# Patient Record
Sex: Female | Born: 1935 | Race: White | Hispanic: No | Smoking: Never smoker
Health system: Southern US, Community
[De-identification: ages and names within clinical notes are randomized; demographics above are authoritative.]

## PROBLEM LIST (undated history)

## (undated) DIAGNOSIS — E119 Type 2 diabetes mellitus without complications: Secondary | ICD-10-CM

## (undated) HISTORY — DX: Type 2 diabetes mellitus without complications: E11.9

## (undated) HISTORY — PX: ABDOMINAL HYSTERECTOMY: SHX81

## (undated) HISTORY — PX: VARICOSE VEIN SURGERY: SHX832

---

## 2006-12-05 ENCOUNTER — Inpatient Hospital Stay: Payer: Self-pay | Admitting: Internal Medicine

## 2006-12-05 ENCOUNTER — Other Ambulatory Visit: Payer: Self-pay

## 2007-08-06 ENCOUNTER — Ambulatory Visit: Payer: Self-pay | Admitting: Family Medicine

## 2007-08-07 ENCOUNTER — Ambulatory Visit: Payer: Self-pay | Admitting: Family Medicine

## 2007-08-14 ENCOUNTER — Ambulatory Visit: Payer: Self-pay | Admitting: Cardiology

## 2007-08-14 ENCOUNTER — Inpatient Hospital Stay (HOSPITAL_COMMUNITY): Admission: EM | Admit: 2007-08-14 | Discharge: 2007-08-20 | Payer: Self-pay | Admitting: Emergency Medicine

## 2007-08-15 ENCOUNTER — Ambulatory Visit: Payer: Self-pay | Admitting: Vascular Surgery

## 2007-08-15 ENCOUNTER — Encounter (INDEPENDENT_AMBULATORY_CARE_PROVIDER_SITE_OTHER): Payer: Self-pay | Admitting: Internal Medicine

## 2008-12-25 ENCOUNTER — Inpatient Hospital Stay (HOSPITAL_COMMUNITY): Admission: EM | Admit: 2008-12-25 | Discharge: 2008-12-29 | Payer: Self-pay | Admitting: Emergency Medicine

## 2008-12-25 ENCOUNTER — Ambulatory Visit: Payer: Self-pay | Admitting: Cardiology

## 2008-12-27 ENCOUNTER — Ambulatory Visit: Payer: Self-pay | Admitting: Physical Medicine & Rehabilitation

## 2008-12-28 ENCOUNTER — Encounter (INDEPENDENT_AMBULATORY_CARE_PROVIDER_SITE_OTHER): Payer: Self-pay | Admitting: Internal Medicine

## 2009-01-22 ENCOUNTER — Ambulatory Visit: Payer: Self-pay | Admitting: Infectious Diseases

## 2009-01-22 ENCOUNTER — Observation Stay (HOSPITAL_COMMUNITY): Admission: EM | Admit: 2009-01-22 | Discharge: 2009-01-24 | Payer: Self-pay | Admitting: Emergency Medicine

## 2009-01-27 ENCOUNTER — Inpatient Hospital Stay (HOSPITAL_COMMUNITY): Admission: EM | Admit: 2009-01-27 | Discharge: 2009-02-01 | Payer: Self-pay | Admitting: Emergency Medicine

## 2010-07-20 LAB — GLUCOSE, CAPILLARY
Glucose-Capillary: 101 mg/dL — ABNORMAL HIGH (ref 70–99)
Glucose-Capillary: 105 mg/dL — ABNORMAL HIGH (ref 70–99)
Glucose-Capillary: 105 mg/dL — ABNORMAL HIGH (ref 70–99)
Glucose-Capillary: 106 mg/dL — ABNORMAL HIGH (ref 70–99)
Glucose-Capillary: 119 mg/dL — ABNORMAL HIGH (ref 70–99)
Glucose-Capillary: 136 mg/dL — ABNORMAL HIGH (ref 70–99)
Glucose-Capillary: 144 mg/dL — ABNORMAL HIGH (ref 70–99)
Glucose-Capillary: 145 mg/dL — ABNORMAL HIGH (ref 70–99)
Glucose-Capillary: 150 mg/dL — ABNORMAL HIGH (ref 70–99)
Glucose-Capillary: 160 mg/dL — ABNORMAL HIGH (ref 70–99)
Glucose-Capillary: 169 mg/dL — ABNORMAL HIGH (ref 70–99)
Glucose-Capillary: 60 mg/dL — ABNORMAL LOW (ref 70–99)
Glucose-Capillary: 73 mg/dL (ref 70–99)
Glucose-Capillary: 86 mg/dL (ref 70–99)

## 2010-07-20 LAB — CBC
HCT: 35 % — ABNORMAL LOW (ref 36.0–46.0)
HCT: 36.9 % (ref 36.0–46.0)
Hemoglobin: 12.1 g/dL (ref 12.0–15.0)
MCV: 90.8 fL (ref 78.0–100.0)
MCV: 91.7 fL (ref 78.0–100.0)
Platelets: 199 10*3/uL (ref 150–400)
Platelets: 227 10*3/uL (ref 150–400)
RBC: 4.06 MIL/uL (ref 3.87–5.11)
RBC: 4.13 MIL/uL (ref 3.87–5.11)
WBC: 4.2 10*3/uL (ref 4.0–10.5)
WBC: 4.9 10*3/uL (ref 4.0–10.5)
WBC: 5.9 10*3/uL (ref 4.0–10.5)

## 2010-07-20 LAB — URINALYSIS, ROUTINE W REFLEX MICROSCOPIC
Glucose, UA: 250 mg/dL — AB
Hgb urine dipstick: NEGATIVE
Protein, ur: NEGATIVE mg/dL
Specific Gravity, Urine: 1.005 (ref 1.005–1.030)
Urobilinogen, UA: 1 mg/dL (ref 0.0–1.0)
pH: 7 (ref 5.0–8.0)

## 2010-07-20 LAB — CARDIAC PANEL(CRET KIN+CKTOT+MB+TROPI)
CK, MB: 0.7 ng/mL (ref 0.3–4.0)
CK, MB: 0.9 ng/mL (ref 0.3–4.0)
Total CK: 24 U/L (ref 7–177)
Total CK: 28 U/L (ref 7–177)
Troponin I: 0.01 ng/mL (ref 0.00–0.06)
Troponin I: 0.01 ng/mL (ref 0.00–0.06)
Troponin I: <0.01 ng/mL (ref 0.00–0.06)

## 2010-07-20 LAB — COMPREHENSIVE METABOLIC PANEL
ALT: 11 U/L (ref 0–35)
AST: 12 U/L (ref 0–37)
Albumin: 3.3 g/dL — ABNORMAL LOW (ref 3.5–5.2)
CO2: 26 mEq/L (ref 19–32)
Calcium: 9.1 mg/dL (ref 8.4–10.5)
Creatinine, Ser: 0.75 mg/dL (ref 0.4–1.2)
GFR calc Af Amer: >60 mL/min (ref >60)
Sodium: 136 mEq/L (ref 135–145)
Total Protein: 6 g/dL (ref 6.0–8.3)

## 2010-07-20 LAB — BASIC METABOLIC PANEL
BUN: 7 mg/dL (ref 6–23)
Calcium: 9.4 mg/dL (ref 8.4–10.5)
Chloride: 101 mEq/L (ref 96–112)
Chloride: 102 mEq/L (ref 96–112)
Chloride: 107 mEq/L (ref 96–112)
Creatinine, Ser: 0.75 mg/dL (ref 0.4–1.2)
GFR calc Af Amer: >60 mL/min (ref >60)
GFR calc Af Amer: >60 mL/min (ref >60)
GFR calc non Af Amer: >60 mL/min (ref >60)
Glucose, Bld: 70 mg/dL (ref 70–99)
Potassium: 3.4 mEq/L — ABNORMAL LOW (ref 3.5–5.1)
Potassium: 3.8 mEq/L (ref 3.5–5.1)
Sodium: 135 mEq/L (ref 135–145)
Sodium: 139 mEq/L (ref 135–145)

## 2010-07-20 LAB — POCT CARDIAC MARKERS
CKMB, poc: <1 ng/mL — ABNORMAL LOW (ref 1.0–8.0)
Myoglobin, poc: 32.2 ng/mL (ref 12–200)
Myoglobin, poc: 50.8 ng/mL (ref 12–200)
Troponin i, poc: <0.05 ng/mL (ref 0.00–0.09)

## 2010-07-20 LAB — DIFFERENTIAL
Eosinophils Absolute: 0.1 10*3/uL (ref 0.0–0.7)
Eosinophils Relative: 2 % (ref 0–5)
Lymphocytes Relative: 14 % (ref 12–46)
Lymphocytes Relative: 23 % (ref 12–46)
Lymphs Abs: 0.8 10*3/uL (ref 0.7–4.0)
Lymphs Abs: 1 10*3/uL (ref 0.7–4.0)
Monocytes Relative: 11 % (ref 3–12)
Monocytes Relative: 8 % (ref 3–12)
Neutro Abs: 2.6 10*3/uL (ref 1.7–7.7)
Neutrophils Relative %: 62 % (ref 43–77)

## 2010-07-20 LAB — LIPID PANEL
HDL: 48 mg/dL (ref >39)
Total CHOL/HDL Ratio: 3.2 RATIO
Triglycerides: 91 mg/dL (ref <150)

## 2010-07-20 LAB — PROTIME-INR: INR: 1.02 (ref 0.00–1.49)

## 2010-07-20 LAB — TSH: TSH: 1.285 u[IU]/mL (ref 0.350–4.500)

## 2010-07-20 LAB — APTT: aPTT: 28 seconds (ref 24–37)

## 2010-07-21 LAB — GLUCOSE, CAPILLARY
Glucose-Capillary: 137 mg/dL — ABNORMAL HIGH (ref 70–99)
Glucose-Capillary: 139 mg/dL — ABNORMAL HIGH (ref 70–99)
Glucose-Capillary: 158 mg/dL — ABNORMAL HIGH (ref 70–99)
Glucose-Capillary: 164 mg/dL — ABNORMAL HIGH (ref 70–99)
Glucose-Capillary: 184 mg/dL — ABNORMAL HIGH (ref 70–99)
Glucose-Capillary: 197 mg/dL — ABNORMAL HIGH (ref 70–99)
Glucose-Capillary: 226 mg/dL — ABNORMAL HIGH (ref 70–99)
Glucose-Capillary: 75 mg/dL (ref 70–99)

## 2010-07-21 LAB — CBC
HCT: 39.6 % (ref 36.0–46.0)
MCHC: 34.3 g/dL (ref 30.0–36.0)
MCV: 90.6 fL (ref 78.0–100.0)
Platelets: 264 10*3/uL (ref 150–400)
RBC: 4.37 MIL/uL (ref 3.87–5.11)
WBC: 6.3 10*3/uL (ref 4.0–10.5)

## 2010-07-21 LAB — URINALYSIS, ROUTINE W REFLEX MICROSCOPIC
Protein, ur: NEGATIVE mg/dL
Urobilinogen, UA: 0.2 mg/dL (ref 0.0–1.0)

## 2010-07-21 LAB — DIFFERENTIAL
Basophils Absolute: 0 10*3/uL (ref 0.0–0.1)
Lymphocytes Relative: 16 % (ref 12–46)
Neutro Abs: 4.5 10*3/uL (ref 1.7–7.7)
Neutrophils Relative %: 71 % (ref 43–77)

## 2010-07-21 LAB — LIPID PANEL
Cholesterol: 159 mg/dL (ref 0–200)
HDL: 45 mg/dL (ref >39)
LDL Cholesterol: 79 mg/dL (ref 0–99)
Triglycerides: 177 mg/dL — ABNORMAL HIGH (ref <150)

## 2010-07-21 LAB — HOMOCYSTEINE: Homocysteine: 18.8 umol/L — ABNORMAL HIGH (ref 4.0–15.4)

## 2010-07-21 LAB — URINE CULTURE: Colony Count: >=100000

## 2010-07-21 LAB — PROTIME-INR
INR: 1 (ref 0.00–1.49)
Prothrombin Time: 13 seconds (ref 11.6–15.2)

## 2010-07-21 LAB — URINE MICROSCOPIC-ADD ON

## 2010-07-21 LAB — COMPREHENSIVE METABOLIC PANEL
BUN: 9 mg/dL (ref 6–23)
CO2: 26 mEq/L (ref 19–32)
Chloride: 103 mEq/L (ref 96–112)
Creatinine, Ser: 0.66 mg/dL (ref 0.4–1.2)
GFR calc non Af Amer: >60 mL/min (ref >60)
Total Bilirubin: 0.6 mg/dL (ref 0.3–1.2)

## 2010-08-29 NOTE — Discharge Summary (Signed)
NAMEELLERIE, ARENZ NO.:  [id number]   MEDICAL RECORD NO.:  [id number]          PATIENT TYPE:  INP   LOCATION:  5504                         FACILITY:  [facility]   PHYSICIAN:  Lucita Ferrara, MD         DATE OF BIRTH:  05-24-1921   DATE OF ADMISSION:  08/14/2007  DATE OF DISCHARGE:                               DISCHARGE SUMMARY   DATE OF DISCHARGE:  Yet to be determined but likely Aug 20, 2007.   DISCHARGE DIAGNOSES:  1. Syncopal episode.  2. Questionable absence seizure or other non convulsive seizure needs      to be ruled out.  3. Syncope workup negative.  4. Uncontrolled diabetes.  5. Hypertension.   CONSULTANTS:  [facility], Dr. Vickey Huger.   PROCEDURES:  The patient had a 2D echocardiogram dated Aug 15, 2007 which  was normal.  Ejection fraction 65%.  The patient had bilateral carotid  Dopplers negative.  The patient had a 2-hour EEG which showed normal  study in awake state for age.  The patient had an MRI, MRA of the brain  dated Aug 16, 2007 which showed no acute intracranial abnormality.  The  patient had a CT scan of the head dated Aug 15, 2007 which showed no  identified acute abnormalities, age-related atrophy.   HISTORY OF PRESENT ILLNESS:  Ms. Melissa Robbins is an 77 year old female who  presented to [facility] on August 14, 2007 with frequent  episodes of dizziness x2 weeks lasting 5-10 minutes, no identified  seizure activity including foaming of the mouth.  She actually had an  aura and flashing lights and atypical symptoms including birds chirping  in the background prior to passing out.  She was unresponsive for a  brief period of time.  She had some neck and frontal headache prior to  events.  She was admitted to the medical telemetry unit and syncope  workup, including studies above, were initiated including neuro checks  as well.  She was also monitored on telemetry.  Cardiac enzymes were  sent.  Neurology was  consulted.  The patient was empirically begun on  Keppra as seizure activity could not be ruled out with only a 2-hour  video monitor.  Today she is hemodynamically stable.  She had no  recurrent events described in the history of present illness.  I had a  discussion with Dr. Vickey Huger on the telephone today.  She says it is  okay for the patient to go home to follow up with her in the office.  Note that the Keppra that was initiated in the hospital did make her  feel somnolent.  In discussion with Dr. Vickey Huger, this can happen as a  known side effect.  I am going to go ahead and send her home with Keppra  250 mg every night.   Other medications to go home with are discharge medications as follows:  1. Keppra 250 mg q.h.s.  2. She is to resume her home medications including multivitamins.  3. Glucovance 5/500 two times a day.  4. Namenda 10 mg b.i.d.  5. Restoril  50 mg daily.  6. Flexeril 10 mg t.i.d.  7. AcipHex 20 mg daily.  8. Pravachol 40 mg daily.  9. Lisinopril 5 mg daily.  10.Aricept 5 mg b.i.d.   Currently the patient is hemodynamically stable.  Blood pressure is  100/58, temperature 96.8, pulse 85, respirations 15, pulse ox 94% on  room air.  CBG is 85 and 157.  I have explained the plans and  procedures, discharge instructions to the patient and the patient  understands.      Lucita Ferrara, MD  Electronically Signed     RR/MEDQ  D:  08/20/2007  T:  08/20/2007  Job:  [id number]

## 2010-08-29 NOTE — Consult Note (Signed)
NAMEMarland Robbins  [name], [name] NO.:  [id number]   MEDICAL RECORD NO.:  [id number]          PATIENT TYPE:  INP   LOCATION:  5504                         FACILITY:  [facility]   PHYSICIAN:  Melvyn Novas, M.D.  DATE OF BIRTH:  [birth year]   DATE OF CONSULTATION:  DATE OF DISCHARGE:                                 CONSULTATION   This is an 77 year old patient currently on the encompassed team today  seen by Dr. Lilly Cove.  This pleasant 77 year old Caucasian right-  handed female, mother of 28, has a history of multiple syncopes that  might have started already months ago.  Over the last 3 weeks, there was an increase in frequencies of her  spells that are described as tearing off, but not as falling.  The patient states that sometimes she has a dark cloud right in front of  her, feels a little bit of a sense of doom and tries to find a chair or  something to sit in.  She then seems to pass out and awakens again not  knowing how long she might have been out.  When she awakens, she is describing her feeling as being empty headed  and slightly confused.  She presented now with a new spell that was  seconds in duration followed by a confusional episode, at this time  witnessed by family member as she was in the Robbins preparing dinner.  Dr. Karilyn Cota suspected a possible seizure history since the cardiology  workup has not given any results, and also the patient was diagnosed  with a first-degree heart block.  There was nothing else found that could explain such frequent spells.   The patient had an MRI of the brain which was normal and had multiple  EKGs and cardiac enzyme evaluations.  I understand that her cardiologist  here in the hospital is Dr. Tresa Endo.  She had nonspecific T-wave  abnormalities, but truly shows normal sinus rhythm.  She also was on  telemetry without showing any abnormalities.  She has undergone carotid Doppler studies which show no ICA stenosis .   HbA1c was 8.1, so the patient is definitely diabetic.  She also suffers  from hypothyroidism.   Echo showed an ejection fraction of 65%.  No left ventricular wall  abnormalities were noted.  A head scan was normal.  A CBG was 132-184 in  the morning.  Potassium was in normal range as the sodium and CO2.  Creatinine was 0.8 and BUN was 11.  The patient shows no focal physical  signs or focal neurologic abnormalities and is alert and oriented in no  acute distress.   The patient's blood pressure is 120/60, heart rate is 70, and  respiratory rate is 16 with a room air oxygenation at 95% saturation.  She has no peripheral clubbing, cyanosis, or edema.  She has a poor  dental status, but has a symmetric face.  Tongue and uvula movements are midline.  She is alert and oriented with  fluent speech.  No apraxia.  No aphagia.  No dysarthria.  She shows full extraocular movements with occasional spontaneous  horizontal nystagmus.  No sensory loss over the face.  Pupils react  equal.  Motor examination shows equal muscle tone and strength was equal  reflexes.  \  Sensory is intact to touch and pinprick.  Coordination to finger-to-nose test shows bilateral slowing, but no  ataxia, dysmetria, or tremor.   SOCIAL HISTORY, FAMILY HISTORY, AND PAST MEDICAL HISTORY:  The patient  has a past medical history of uncontrolled diabetes mellitus and  uncontrolled hypertension and first-degree heart block.  She has also been diagnosed with coronary artery disease,  hyperlipidemia, had a TIA in August last year, and suffers from macular  degeneration.  Her [facility] primary care physician has diagnosed her with mild-to-  moderate dementia according to the admission note.   The patient is a mother of several adult healthy children that mostly  live in the area.  She has no history of drug or tobacco abuse.  She is  retired.  She states that her father had heart disease and her mother  had heart disease as  well.   ASSESSMENT:  Possible Absence seizures or other nonconvulsive seizures  needs to be entertained in the differential, as the cardiology workup  has not shown clear explanation.  The brittle diabetes, however, might also contribute to her spells.   My plan is to order an EEG with hyperventilation.  Hyperventilation is  especially important in the workup of Absence  seizure.  I would otherwise consider a prolonged video monitoring.  A low dose  Keppra at 250 mg nightly should be used for empiric treatment after the  EEG.  I would use the Keppra to see if spells occur less frequent in the neck  6 weeks in spite of a perhaps normal EEG.  If the EEG is abnormal, the Keppra should be initiated anyway. I  discessed the posible side effects of Keppra with the patient and one of  her daughters, sleepiness, depression and compulsive behaviours.  However, Keppra does not cause osteopenia , does not requiere frequent  blood level checks and is generically availbale.  If side effects occur, I like to consider Tegretol or Dilantin, but not  as first line agents.   I discussed these steps with Dr. Lilly Cove today, and the patient  can follow up outpatient with [place] in neurology.  Our phone number is [phone].      Melvyn Novas, M.D.  Electronically Signed     CD/MEDQ  D:  08/17/2007  T:  08/18/2007  Job:  [id number]

## 2010-08-29 NOTE — Procedures (Signed)
EEG NUMBER:  [id number].   REFERRING PHYSICIAN:  Wilson Singer, MD.   CLINICAL HISTORY:  This is a routine EEG performed with photic  stimulation and hyperventilation.  The patient is described as awake and  drowsy.  An 77 year old woman admitted on August 14, 2007 for syncope.  EEG is performed for evaluation.   DESCRIPTION:  The dominant rhythm in this tracing is seen intermittently  throughout the record, and is a low amplitude alpha rhythm of 9-10 Hz,  which predominates posteriorly without abnormal asymmetry.  More  intermittently throughout the record, diffuse low amplitude fast  activity is seen.  The patient is seen to remain in awake state  throughout the recording.  Photic stimulation elicits strong driving  responses.  Hyperventilation produces an increased prominence of the  alpha, and eventually a little bit of slowing in buildup, but no  appearance of focal abnormalities.  Single channel devoted EKG revealed  sinus rhythm throughout with the rate of approximately 84 beats per  minute.   CONCLUSIONS:  Normal study in the awake state for age.      Michael L. Thad Ranger, M.D.  Electronically Signed     EAV:[id number]  D:  08/18/2007 18:46:21  T:  08/19/2007 07:43:55  Job #:  [id number]

## 2010-08-29 NOTE — H&P (Signed)
NAMESHERILL, Robbins NO.:  [id number]   MEDICAL RECORD NO.:  [id number]          PATIENT TYPE:  INP   LOCATION:  5504                         FACILITY:  [facility]   PHYSICIAN:  Herbie Saxon, MDDATE OF BIRTH:  1921-05-16   DATE OF ADMISSION:  08/14/2007  DATE OF DISCHARGE:                              HISTORY & PHYSICAL   PRIMARY CARE PHYSICIAN:  Cecille Amsterdam, MD   PRESENTING COMPLAINTS:  Passing out episodes 3 times within the last 2  weeks.   HISTORY OF PRESENTING COMPLAINTS:  This is an 77 year old Caucasian lady  who is complaining of frequent episodes of dizziness in the last 2 weeks  and she has actually passed out each episode lasting between 5-10  minutes.  No seizure activity.  The patient reports another episode this  afternoon with an aura of a black cloud, flashing lights, and birds  chipping prior to passing out.  She pressed a lifeline button, but she  is not sure of the length of her responsiveness.  The patient gives a  vague history of chest discomfort with some radiation to the neck and a  mild frontal headache, but she denies any heart fluttering or  palpitation.  She does have a past medical history of coronary artery  disease.  She denies any paroxysmal nocturnal dyspnea or orthopnea.  She  complains of unstable gait and occasional vertiginous feeling.  She  denies any tinnitus or loss of hearing.  The patient is legally blind.  She has poor visual acuity.  There was no episode of urinary  incontinence or lip biting after the unresponsive episodes.  The patient  does not complain of any genitourinary symptoms.  She is chronically  constipated.  There is no fever.  No limb weakness.  She just complained  of intermittent tingling in the extremities and bilateral hands.  No new  skin rash or joint swelling.   She had been placed on a Holter monitor by her primary care physician.  She also did get a CT brain and Doppler of the neck as  per patient, but  she is not sure of the results.  No documentation of any echocardiogram  in the last 6 months as per patient.   PAST MEDICAL HISTORY:  1. Coronary artery disease.  2. Transient ischemic attack.  3. Macular degeneration.  4. Hypertension.  5. Hyperlipidemia.  6. Diabetes mellitus.  7. Dementia.   FAMILY HISTORY:  Father had a heart disease.  Mother also had a heart  valvular disease.   PAST SURGERIES:  1. Appendectomy.  2. Hysterectomy.  3. Umbilical ulcer surgery.  4. Bilateral leg venous stripping.   SOCIAL HISTORY:  She is retired.  There is no history of drug, alcohol,  or tobacco abuse.   MEDICATIONS:  1. Multivitamin 1 tablet daily.  2. Glucovance 5/500 one tablet b.i.d.  3. Namenda 10 mg b.i.d.  4. Restoril 15 mg daily.  5. Flexeril 10 mg t.i.d.  6. Aciphex 20 mg daily.  7. Pravachol 40 mg daily.  8. Lisinopril 5 mg daily.  9. Aricept 5 mg b.i.d.   ALLERGIES:  She is allergic to ASPIRIN and LIPITOR.   REVIEW OF SYSTEMS:  Twelve systems reviewed, the pertinent positive is  dictated in the history of presenting complaints.   PHYSICAL EXAMINATION:  GENERAL: On examination, she is an 77 year old lady,  not in acute respiratory distress.  VITAL SIGNS: Her temperature is 98, pulse is 89, respiratory rate 18,  and blood pressure 186/89 supine.  HEENT: Reduced visual acuity.  Pupils are equal and reactive to light  and accommodation.  Extraocular muscles are intact.  Mucous membranes  are moist.  Head is atraumatic and normocephalic.  NECK: Supple.  There is no carotid bruit.  HEART: Sounds 1 and 2, regular rate and rhythm.  A 2/6 systolic murmur.  ABDOMEN: Benign.  NEUROLOGIC: She is alert and oriented to time, place, and person.  Peripheral pulses present.  No edema.  Power is 5 in all limbs and deep  tendon reflexes are 2 globally.   Available labs show a WBC of 5.3, hematocrit 36, and platelet count is  201.  Chemistry shows a sodium of 135,  potassium 3.7, chloride 101,  bicarbonate 23, glucose 281, BUN 9, and creatinine is 0.9.  Hemoccult is  negative.  Chest x-ray shows mild atelectasis at the left base.  The EKG  shows a normal sinus rhythm with first-degree AV block.   ASSESSMENT:  Syncopal episode, ? etiology;  hypertension urgency  uncontrolled diabetes  first-degree heart block.  PLAN  The patient is to be admitted to the telemetry bed.  We will get a  carotid Doppler, 2-D echocardiogram, and CT brain without contrast.  We  will get the serial cardiac enzymes q.8 h. x3.  Hemoccult x2.  We will  get a thyroid function test, fasting lipids, and coagulation parameters.  She should be on IV fluid normal saline of 40 mL an hour.  Diet will be  1800-calorie ADA heart healthy.  Put on Lovenox 40 mg subcu daily for  DVT prophylaxis and Protonix 40 mg IV daily.  Increase her lisinopril  dose to 20 mg daily; add hydralazine 50 mg b.i.d.; clonidine 0.1 mg q.8  h. p.r.n. if the blood pressure is greater than 160/110, hold the  clonidine if heart rate is less than 55 or blood pressure  less than 120/80; orthostatic blood pressure check in the a.m.; start on  meclizine 25 mg q.8 h. p.r.n.; continue her home medications; and also  put on Protonix 40 mg IV daily.  She is to be on seizure and fall  precautions, and bed rest in the night for the first 24 hours.  Consider  cardiology and neurology evaluation in a.m.      Herbie Saxon, MD  Electronically Signed     MIO/MEDQ  D:  08/14/2007  T:  08/15/2007  Job:  [id number]   cc:   Cecille Amsterdam

## 2010-09-19 ENCOUNTER — Emergency Department: Payer: Self-pay | Admitting: Internal Medicine

## 2011-01-09 LAB — DIFFERENTIAL
Basophils Absolute: 0
Basophils Relative: 1
Eosinophils Absolute: 0.2
Eosinophils Relative: 4
Lymphocytes Relative: 25
Lymphs Abs: 1.3
Monocytes Absolute: 0.6
Monocytes Relative: 11
Neutro Abs: 3.1
Neutrophils Relative %: 59

## 2011-01-09 LAB — BASIC METABOLIC PANEL
CO2: 23
Calcium: 9.3
GFR calc Af Amer: >60
GFR calc non Af Amer: 54 — ABNORMAL LOW
Glucose, Bld: 281 — ABNORMAL HIGH
Potassium: 3.7
Sodium: 135

## 2011-01-09 LAB — CBC
HCT: 36
Hemoglobin: 12.4
MCHC: 34.3
MCV: 90.6
Platelets: 201
RBC: 3.97
RDW: 13
WBC: 5.3

## 2011-01-09 LAB — BASIC METABOLIC PANEL WITH GFR
BUN: 9
Chloride: 101
Creatinine, Ser: 0.99

## 2011-01-09 LAB — OCCULT BLOOD X 1 CARD TO LAB, STOOL: Fecal Occult Bld: NEGATIVE

## 2011-03-21 ENCOUNTER — Emergency Department: Payer: Self-pay | Admitting: Emergency Medicine

## 2011-06-23 ENCOUNTER — Emergency Department: Payer: Self-pay | Admitting: Emergency Medicine

## 2011-06-23 LAB — URINALYSIS, COMPLETE
Bilirubin,UR: NEGATIVE
Glucose,UR: NEGATIVE mg/dL (ref 0–75)
Leukocyte Esterase: NEGATIVE
Specific Gravity: 1.005 (ref 1.003–1.030)
Squamous Epithelial: NONE SEEN

## 2011-06-23 LAB — COMPREHENSIVE METABOLIC PANEL
Albumin: 3.6 g/dL (ref 3.4–5.0)
Alkaline Phosphatase: 42 U/L — ABNORMAL LOW (ref 50–136)
Anion Gap: 15 (ref 7–16)
Bilirubin,Total: 0.5 mg/dL (ref 0.2–1.0)
Calcium, Total: 9 mg/dL (ref 8.5–10.1)
Creatinine: 0.66 mg/dL (ref 0.60–1.30)
Glucose: 139 mg/dL — ABNORMAL HIGH (ref 65–99)
Osmolality: 279 (ref 275–301)
Potassium: 4.3 mmol/L (ref 3.5–5.1)
SGOT(AST): 27 U/L (ref 15–37)
Sodium: 138 mmol/L (ref 136–145)
Total Protein: 7.2 g/dL (ref 6.4–8.2)

## 2011-06-23 LAB — CBC
HCT: 37.2 % (ref 35.0–47.0)
HGB: 12.6 g/dL (ref 12.0–16.0)
MCH: 30.6 pg (ref 26.0–34.0)
MCV: 90 fL (ref 80–100)
Platelet: 182 10*3/uL (ref 150–440)
RDW: 13.7 % (ref 11.5–14.5)
WBC: 6.3 10*3/uL (ref 3.6–11.0)

## 2012-08-20 ENCOUNTER — Ambulatory Visit: Payer: Self-pay | Admitting: Family Medicine

## 2012-09-25 ENCOUNTER — Emergency Department: Payer: Self-pay | Admitting: Emergency Medicine

## 2012-09-25 LAB — COMPREHENSIVE METABOLIC PANEL
Albumin: 3.8 g/dL (ref 3.4–5.0)
Alkaline Phosphatase: 38 U/L — ABNORMAL LOW (ref 50–136)
Anion Gap: 8 (ref 7–16)
Bilirubin,Total: 0.5 mg/dL (ref 0.2–1.0)
Calcium, Total: 9.1 mg/dL (ref 8.5–10.1)
Chloride: 95 mmol/L — ABNORMAL LOW (ref 98–107)
EGFR (African American): >60
Osmolality: 254 (ref 275–301)
Potassium: 4.2 mmol/L (ref 3.5–5.1)
SGOT(AST): 19 U/L (ref 15–37)
SGPT (ALT): 18 U/L (ref 12–78)
Sodium: 126 mmol/L — ABNORMAL LOW (ref 136–145)
Total Protein: 7.1 g/dL (ref 6.4–8.2)

## 2012-09-25 LAB — TROPONIN I: Troponin-I: <0.02 ng/mL

## 2012-09-25 LAB — CBC
MCH: 30.8 pg (ref 26.0–34.0)
Platelet: 270 10*3/uL (ref 150–440)
RBC: 4.45 10*6/uL (ref 3.80–5.20)
RDW: 14 % (ref 11.5–14.5)
WBC: 8.3 10*3/uL (ref 3.6–11.0)

## 2012-09-25 LAB — URINALYSIS, COMPLETE
Bacteria: NONE SEEN
Ketone: NEGATIVE
Ph: 6 (ref 4.5–8.0)
Protein: NEGATIVE
RBC,UR: 2 /HPF (ref 0–5)
Specific Gravity: 1.008 (ref 1.003–1.030)
Squamous Epithelial: <1
WBC UR: <1 /HPF (ref 0–5)

## 2012-09-26 ENCOUNTER — Inpatient Hospital Stay: Payer: Self-pay | Admitting: Internal Medicine

## 2012-09-26 LAB — COMPREHENSIVE METABOLIC PANEL
Albumin: 4.1 g/dL (ref 3.4–5.0)
Alkaline Phosphatase: 45 U/L — ABNORMAL LOW (ref 50–136)
Anion Gap: 6 — ABNORMAL LOW (ref 7–16)
BUN: 10 mg/dL (ref 7–18)
Bilirubin,Total: 0.6 mg/dL (ref 0.2–1.0)
Calcium, Total: 9.4 mg/dL (ref 8.5–10.1)
Chloride: 96 mmol/L — ABNORMAL LOW (ref 98–107)
Co2: 27 mmol/L (ref 21–32)
Creatinine: 0.9 mg/dL (ref 0.60–1.30)
EGFR (African American): >60
EGFR (Non-African Amer.): 57 — ABNORMAL LOW
Glucose: 111 mg/dL — ABNORMAL HIGH (ref 65–99)
SGOT(AST): 22 U/L (ref 15–37)
SGPT (ALT): 18 U/L (ref 12–78)
Sodium: 129 mmol/L — ABNORMAL LOW (ref 136–145)

## 2012-09-26 LAB — URINALYSIS, COMPLETE
Bilirubin,UR: NEGATIVE
Glucose,UR: 150 mg/dL (ref 0–75)
Ketone: NEGATIVE
Leukocyte Esterase: NEGATIVE
Protein: NEGATIVE
RBC,UR: 1 /HPF (ref 0–5)
Specific Gravity: 1.006 (ref 1.003–1.030)
WBC UR: <1 /HPF (ref 0–5)

## 2012-09-26 LAB — CBC
HCT: 39.6 % (ref 35.0–47.0)
HGB: 13.7 g/dL (ref 12.0–16.0)
MCV: 88 fL (ref 80–100)
WBC: 11.7 10*3/uL — ABNORMAL HIGH (ref 3.6–11.0)

## 2012-09-27 LAB — LIPID PANEL
Cholesterol: 158 mg/dL (ref 0–200)
HDL Cholesterol: 80 mg/dL — ABNORMAL HIGH (ref 40–60)
Ldl Cholesterol, Calc: 48 mg/dL (ref 0–100)
Triglycerides: 152 mg/dL (ref 0–200)
VLDL Cholesterol, Calc: 30 mg/dL (ref 5–40)

## 2012-09-27 LAB — SODIUM: Sodium: 132 mmol/L — ABNORMAL LOW

## 2012-09-27 LAB — HEMOGLOBIN A1C: Hemoglobin A1C: 7 % — ABNORMAL HIGH

## 2012-09-28 LAB — BASIC METABOLIC PANEL
Anion Gap: 10 (ref 7–16)
BUN: 11 mg/dL (ref 7–18)
Calcium, Total: 9.4 mg/dL (ref 8.5–10.1)
EGFR (African American): >60
Glucose: 162 mg/dL — ABNORMAL HIGH (ref 65–99)
Osmolality: 266 (ref 275–301)
Potassium: 3.8 mmol/L (ref 3.5–5.1)

## 2012-09-29 LAB — BASIC METABOLIC PANEL
Anion Gap: 9 (ref 7–16)
BUN: 15 mg/dL (ref 7–18)
Calcium, Total: 9.4 mg/dL (ref 8.5–10.1)
Chloride: 95 mmol/L — ABNORMAL LOW (ref 98–107)
Creatinine: 0.74 mg/dL (ref 0.60–1.30)
EGFR (African American): >60
Osmolality: 264 (ref 275–301)
Potassium: 3.4 mmol/L — ABNORMAL LOW (ref 3.5–5.1)
Sodium: 129 mmol/L — ABNORMAL LOW (ref 136–145)

## 2012-10-23 ENCOUNTER — Other Ambulatory Visit: Payer: Self-pay | Admitting: Family Medicine

## 2012-10-23 LAB — URINALYSIS, COMPLETE
Bilirubin,UR: NEGATIVE
Glucose,UR: >=500 mg/dL (ref 0–75)
Ketone: NEGATIVE
Nitrite: NEGATIVE
Ph: 5 (ref 4.5–8.0)
Protein: 30
Specific Gravity: 1.011 (ref 1.003–1.030)
Squamous Epithelial: NONE SEEN
WBC UR: 183 /HPF (ref 0–5)

## 2012-10-27 ENCOUNTER — Other Ambulatory Visit: Payer: Self-pay | Admitting: Physician Assistant

## 2012-10-27 LAB — COMPREHENSIVE METABOLIC PANEL
Alkaline Phosphatase: 94 U/L (ref 50–136)
Anion Gap: 8 (ref 7–16)
Bilirubin,Total: 0.4 mg/dL (ref 0.2–1.0)
Chloride: 95 mmol/L — ABNORMAL LOW (ref 98–107)
Co2: 23 mmol/L (ref 21–32)
EGFR (African American): 32 — ABNORMAL LOW
EGFR (Non-African Amer.): 28 — ABNORMAL LOW
Glucose: 358 mg/dL — ABNORMAL HIGH (ref 65–99)
Osmolality: 282 (ref 275–301)
SGOT(AST): 24 U/L (ref 15–37)
Total Protein: 7 g/dL (ref 6.4–8.2)

## 2012-10-27 LAB — CBC WITH DIFFERENTIAL/PLATELET
Basophil #: 0 10*3/uL (ref 0.0–0.1)
Basophil %: 0.4 %
Eosinophil %: 1.7 %
HGB: 11.8 g/dL — ABNORMAL LOW (ref 12.0–16.0)
Lymphocyte #: 1 10*3/uL (ref 1.0–3.6)
MCHC: 33.6 g/dL (ref 32.0–36.0)
Monocyte #: 1 x10 3/mm — ABNORMAL HIGH (ref 0.2–0.9)
Neutrophil #: 8.8 10*3/uL — ABNORMAL HIGH (ref 1.4–6.5)
RBC: 3.89 10*6/uL (ref 3.80–5.20)
RDW: 14.7 % — ABNORMAL HIGH (ref 11.5–14.5)

## 2012-10-28 ENCOUNTER — Other Ambulatory Visit: Payer: Self-pay | Admitting: Physician Assistant

## 2012-10-28 LAB — CBC WITH DIFFERENTIAL/PLATELET
Basophil #: 0.1 10*3/uL (ref 0.0–0.1)
Basophil %: 0.5 %
Eosinophil %: 2.1 %
HCT: 35.4 % (ref 35.0–47.0)
HGB: 12 g/dL (ref 12.0–16.0)
Lymphocyte #: 0.8 10*3/uL — ABNORMAL LOW (ref 1.0–3.6)
MCH: 30.2 pg (ref 26.0–34.0)
MCV: 89 fL (ref 80–100)
Monocyte #: 1 x10 3/mm — ABNORMAL HIGH (ref 0.2–0.9)
RBC: 3.98 10*6/uL (ref 3.80–5.20)
RDW: 15.1 % — ABNORMAL HIGH (ref 11.5–14.5)
WBC: 11 10*3/uL (ref 3.6–11.0)

## 2012-10-28 LAB — BASIC METABOLIC PANEL
Anion Gap: 9 (ref 7–16)
Calcium, Total: 9.7 mg/dL (ref 8.5–10.1)
EGFR (African American): 30 — ABNORMAL LOW
EGFR (Non-African Amer.): 26 — ABNORMAL LOW
Osmolality: 282 (ref 275–301)
Potassium: 6.3 mmol/L — ABNORMAL HIGH (ref 3.5–5.1)

## 2012-10-29 ENCOUNTER — Other Ambulatory Visit: Payer: Self-pay

## 2012-10-29 LAB — BASIC METABOLIC PANEL
Anion Gap: 6 — ABNORMAL LOW (ref 7–16)
BUN: 49 mg/dL — ABNORMAL HIGH (ref 7–18)
Chloride: 103 mmol/L (ref 98–107)
Creatinine: 1.28 mg/dL (ref 0.60–1.30)
Glucose: 95 mg/dL (ref 65–99)
Potassium: 4.6 mmol/L (ref 3.5–5.1)
Sodium: 134 mmol/L — ABNORMAL LOW (ref 136–145)

## 2012-12-06 ENCOUNTER — Other Ambulatory Visit: Payer: Self-pay | Admitting: Family Medicine

## 2012-12-06 LAB — URINALYSIS, COMPLETE
Bilirubin,UR: NEGATIVE
Glucose,UR: 100 mg/dL (ref 0–75)
Nitrite: NEGATIVE
Ph: 5 (ref 4.5–8.0)
Specific Gravity: 1.02 (ref 1.003–1.030)
Squamous Epithelial: 1
WBC UR: 177 /HPF (ref 0–5)

## 2012-12-08 LAB — URINE CULTURE

## 2013-04-11 ENCOUNTER — Other Ambulatory Visit: Payer: Self-pay | Admitting: Family Medicine

## 2013-04-11 LAB — CBC WITH DIFFERENTIAL/PLATELET
Basophil #: 0.1 10*3/uL (ref 0.0–0.1)
Eosinophil #: 0.6 10*3/uL (ref 0.0–0.7)
HGB: 11.8 g/dL — ABNORMAL LOW (ref 12.0–16.0)
Lymphocyte #: 0.8 10*3/uL — ABNORMAL LOW (ref 1.0–3.6)
Lymphocyte %: 9.9 %
MCHC: 33.9 g/dL (ref 32.0–36.0)
MCV: 86 fL (ref 80–100)
Monocyte #: 1.2 x10 3/mm — ABNORMAL HIGH (ref 0.2–0.9)
Neutrophil #: 5.3 10*3/uL (ref 1.4–6.5)
Platelet: 213 10*3/uL (ref 150–440)
RBC: 4.05 10*6/uL (ref 3.80–5.20)
RDW: 15.6 % — ABNORMAL HIGH (ref 11.5–14.5)
WBC: 8 10*3/uL (ref 3.6–11.0)

## 2013-04-18 ENCOUNTER — Other Ambulatory Visit: Payer: Self-pay | Admitting: Family Medicine

## 2013-04-18 LAB — URINALYSIS, COMPLETE
BILIRUBIN, UR: NEGATIVE
Glucose,UR: 150 mg/dL (ref 0–75)
Ketone: NEGATIVE
LEUKOCYTE ESTERASE: NEGATIVE
Nitrite: NEGATIVE
PH: 5 (ref 4.5–8.0)
PROTEIN: NEGATIVE
RBC,UR: 257 /HPF (ref 0–5)
SPECIFIC GRAVITY: 1.02 (ref 1.003–1.030)
Squamous Epithelial: NONE SEEN
WBC UR: 2382 /HPF (ref 0–5)

## 2013-04-21 LAB — URINE CULTURE

## 2013-04-22 ENCOUNTER — Other Ambulatory Visit: Payer: Self-pay | Admitting: Family Medicine

## 2013-04-22 LAB — CBC WITH DIFFERENTIAL/PLATELET
BASOS ABS: 0.1 10*3/uL (ref 0.0–0.1)
Basophil %: 0.6 %
EOS PCT: 5.6 %
Eosinophil #: 0.5 10*3/uL (ref 0.0–0.7)
HCT: 36.3 % (ref 35.0–47.0)
HGB: 12.3 g/dL (ref 12.0–16.0)
LYMPHS ABS: 1.4 10*3/uL (ref 1.0–3.6)
LYMPHS PCT: 14.3 %
MCH: 29 pg (ref 26.0–34.0)
MCHC: 33.9 g/dL (ref 32.0–36.0)
MCV: 86 fL (ref 80–100)
Monocyte #: 1.1 x10 3/mm — ABNORMAL HIGH (ref 0.2–0.9)
Monocyte %: 10.8 %
Neutrophil #: 6.7 10*3/uL — ABNORMAL HIGH (ref 1.4–6.5)
Neutrophil %: 68.7 %
Platelet: 288 10*3/uL (ref 150–440)
RBC: 4.25 10*6/uL (ref 3.80–5.20)
RDW: 14.7 % — AB (ref 11.5–14.5)
WBC: 9.8 10*3/uL (ref 3.6–11.0)

## 2013-04-22 LAB — COMPREHENSIVE METABOLIC PANEL
ALK PHOS: 63 U/L
AST: 20 U/L (ref 15–37)
Albumin: 2.9 g/dL — ABNORMAL LOW (ref 3.4–5.0)
Anion Gap: 8 (ref 7–16)
BUN: 28 mg/dL — ABNORMAL HIGH (ref 7–18)
Bilirubin,Total: 0.2 mg/dL (ref 0.2–1.0)
CALCIUM: 9.6 mg/dL (ref 8.5–10.1)
CO2: 23 mmol/L (ref 21–32)
CREATININE: 0.96 mg/dL (ref 0.60–1.30)
Chloride: 104 mmol/L (ref 98–107)
GFR CALC NON AF AMER: 52 — AB
GLUCOSE: 235 mg/dL — AB (ref 65–99)
Osmolality: 283 (ref 275–301)
POTASSIUM: 4.6 mmol/L (ref 3.5–5.1)
SGPT (ALT): 31 U/L (ref 12–78)
Sodium: 135 mmol/L — ABNORMAL LOW (ref 136–145)
TOTAL PROTEIN: 7.1 g/dL (ref 6.4–8.2)

## 2013-08-03 ENCOUNTER — Other Ambulatory Visit: Payer: Self-pay | Admitting: Family Medicine

## 2013-08-03 LAB — TSH: Thyroid Stimulating Horm: 1.9 u[IU]/mL

## 2013-09-25 ENCOUNTER — Encounter: Payer: Self-pay | Admitting: Podiatry

## 2013-09-25 ENCOUNTER — Ambulatory Visit (INDEPENDENT_AMBULATORY_CARE_PROVIDER_SITE_OTHER): Payer: Medicaid Other | Admitting: Podiatry

## 2013-09-25 VITALS — BP 114/58 | HR 85 | Resp 16

## 2013-09-25 DIAGNOSIS — B351 Tinea unguium: Secondary | ICD-10-CM

## 2013-09-25 DIAGNOSIS — M79609 Pain in unspecified limb: Secondary | ICD-10-CM

## 2013-09-25 NOTE — Progress Notes (Signed)
   Subjective:    Patient ID: Melissa Robbins, female    DOB: 03/12/1922, 80 y.o.   MRN: [id number]  HPI Comments: Her toenails need cutting. They do not hurt. No one takes care of my toenails.      Review of Systems     Objective:   Physical Exam        Assessment & Plan:

## 2013-09-25 NOTE — Progress Notes (Signed)
Subjective:     Patient ID: Melissa MiresKathryn Robbins, female   DOB: 04/26/1921, 80 y.o.   MRN: [id number]  HPI patient presents stating that she has nails that are painful for her. She does have dementia which makes it difficult to follow exactly what's going on   Review of Systems     Objective:   Physical Exam Neurovascular status reviewed with patient and range of motion found to be diminished as is muscle strength. She is in a wheelchair and it was difficult to understand what is hurting her. Patient does have thick nailbeds 1-5 both feet    Assessment:     Mycotic nail with painful nails 1-5 both feet    Plan:     H&P performed debridement nailbeds with no bleeding noted and reappoint as needed

## 2013-10-21 ENCOUNTER — Other Ambulatory Visit: Payer: Self-pay | Admitting: Family Medicine

## 2013-10-21 LAB — URINALYSIS, COMPLETE
Bilirubin,UR: NEGATIVE
KETONE: NEGATIVE
NITRITE: NEGATIVE
Ph: 5 (ref 4.5–8.0)
RBC,UR: NONE SEEN /HPF (ref 0–5)
SPECIFIC GRAVITY: 1.01 (ref 1.003–1.030)
Squamous Epithelial: NONE SEEN
WBC UR: 11615 /HPF (ref 0–5)

## 2013-10-23 LAB — URINE CULTURE

## 2014-04-19 ENCOUNTER — Ambulatory Visit: Payer: Self-pay | Admitting: Family Medicine

## 2014-08-06 NOTE — Discharge Summary (Signed)
PATIENT NAME:  Melissa Robbins, Melissa Robbins MR#:  [id number] DATE OF BIRTH:  1921-06-23  DATE OF ADMISSION:  09/26/2012 DATE OF DISCHARGE:  09/29/2012  ADMISSION DIAGNOSIS: Transient ischemic attack.  DISCHARGE DIAGNOSES: 1.  Transient ischemic attack.  2.  Progressive dementia.  3.  Hyponatremia.  4.  Hypertension.  5.  Diabetes.   CONSULTS: None.   PERTINENT LABORATORY AND DIAGNOSTICS:  CT of the head showed no acute intracranial hemorrhage or CVA.   MRI showed no acute ischemia.   Carotid Doppler showed no evidence of hemodynamically significant stenosis.   2-D echocardiogram showed a EF of 60% to 65% with thickening of the anterior mitral valve leaflet, mildly increased left ventricular posterior wall thickness.   Discharge sodium 129, potassium 3.4, chloride 95, bicarb 25, BUN 15, creatinine 0.74 and glucose is 166.   HOSPITAL COURSE: This is an 81 year old female who presented with dysarthria and slurred speech.  For further details, please refer to the H and P.   1.  Dysarthria with unsteady gait. Probably from a TIA.  The patient was on aspirin and statin. MRI of the brain was negative for acute ischemia. Cardiac Dopplers were negative for stenosis. A 2-D echocardiogram showed normal ejection fraction.  Physical therapy did see the patient and recommend skilled nursing facility. The patient will be going to [facility] today. She does have signs of aspiration, per speech pathology, and she is now on aspiration precautions. This was discussed with the family.  2.  Hyponatremia secondary to poor p.o. intake as well as progressive dementia. This will need to be followed up closely at [facility].  3.  Worsening of dementia. This was discussed with the family. They are aware that her dementia is progressively worsening.  4.  Hypertension. The patient will continue on her medications. 5.  Diabetes. The patient will continue on her outpatient medications. She will also need to be on an  ADA diet and blood sugars will need to be monitored.   DISCHARGE MEDICATIONS: 1.  Glimepiride 2 mg daily.  2.  Sliding scale insulin as directed.  3.  Pravastatin 40 mg at bedtime.  4.  Aspirin 325 mg daily.  5.  Fosamax 70 mg on Sunday.  6.  Synthroid 75 mcg daily.  7.  Norvasc 10 mg daily.   DISCHARGE DIET: Low sodium, ADA diet with Ensure once a day. Please send all meats ground with gravy. Add cream soup and pudding at lunch and dinner meal. Yogurt at breakfast meal.  Tray set up at all meals. Strict aspiration precautions to include sitting fully upright for all meals. Feeding assistance as necessary.  Likes ice cream.  DISCHARGE REFERRAL: Physical therapy.  DISCHARGE COMMENTS:  MD at facility will need to check the patient'Robbins sodium level on Tuesday. Discharge sodium is 129. Check blood sugars before meals.   The patient is medically stable for discharge.   TIME SPENT: Approximately 45 minutes. ____________________________ Janyth ContesSital P. Juliene PinaMody, MD spm:sb D: 09/29/2012 11:22:02 ET T: 09/29/2012 11:34:11 ET JOB#: [id number]  cc: Tamlyn Sides P. Juliene PinaMody, MD, <Dictator> Janyth ContesSITAL P Horace Wishon MD ELECTRONICALLY SIGNED 09/29/2012 14:18

## 2014-08-06 NOTE — H&P (Signed)
PATIENT NAME:  Melissa Robbins, Melissa Robbins MR#:  [id number] DATE OF BIRTH:  18-Nov-1921  DATE OF ADMISSION:  09/26/2012  PRIMARY CARE PHYSICIAN: Rhona Leavens. Burnett Sheng, MD   REFERRING PHYSICIAN: Rebecka Apley, MD  CHIEF COMPLAINT: Slurred speech and frequent falls.   HISTORY OF PRESENT ILLNESS: The patient is an 81 year old Caucasian female with past medical history of dementia, receptive and expressive aphasia, hypothyroidism, hypertension, diabetes mellitus, chronic low back pain and GERD, who was brought into the ER at around 7:00 a.m. yesterday for slurred speech. The patient's daughter had noticed that the patient was not speaking right and brought her into the ER yesterday. At that time, her sodium was at 126. CAT scan of the head was done which was negative for any acute findings, and she was discharged home by the ER physician. After going home, the patient was unsteady while walking and started falling very often. The patient is falling frequently, and speech is not completely recovered back to her normal. The patient is brought into the ER by the family again. A repeat sodium is at 129, and a CAT scan of the head has revealed diffuse cerebral atrophy and hypoattenuation in the periventricular white matter compatible with small vessel ischemia. No hemorrhages or cortical infarct. Hospitalist team is called to admit the patient. According to the ER physician, the patient was not following verbal commands. During my examination, the patient was saying that she has no complaints. The patient denies any chest pain or shortness of breath. As the patient has chronic history of dementia, it was really hard to get more history from the patient. Denies any blurry vision or headache.   PAST MEDICAL HISTORY:  1. Diabetes mellitus.  2. Chronic low back pain. 3. GERD. 4. Hyperlipidemia.  5. Hypothyroidism.  6. Hypertension.  7. Dementia. 8. Delirium. 9. Receptive and expressive aphasia.  10. Macular  degeneration.  PAST SURGICAL HISTORY: Hysterectomy.   ALLERGIES: No known drug allergies.   HOME MEDICATIONS:  1. Pravastatin 40 mg once daily.  2. Metformin 500 mg 2 tablets 2 times a day. 3. Levothyroxine 75 mcg 1 capsule once a day. 4. Glimepiride 2 mg once daily. 5. Fosamax 70 mg once a week.   PSYCHOSOCIAL HISTORY: Lives at home with her daughter. No history of smoking, alcohol or illicit drug usage.   FAMILY HISTORY: Mother had heart problems and diabetes mellitus.  REVIEW OF SYSTEMS: Unable to obtain review of systems as the patient is answering no complaints for most of the questions, and she has chronic history of dementia which has been getting worse recently.    PHYSICAL EXAMINATION:  VITAL SIGNS: Blood pressure is 180/70, pulse oximetry is 94% on 2 liters, temperature is afebrile, respiratory rate 18 to 20 per minute.  GENERAL APPEARANCE: Not in any acute distress, moderately built and moderately nourished.  HEENT: Normocephalic, atraumatic. Pupils are equally reacting to light and accommodation. No scleral icterus. No conjunctival injection. No pharyngeal exudates. No sinus tenderness.  NECK: Supple. No JVD. No thyromegaly.  LUNGS: Clear to auscultation bilaterally. No accessory muscle usage. No anterior chest wall tenderness on palpation.  CARDIAC: S1 and S2 normal, regular rate and rhythm, no murmurs.  GASTROINTESTINAL: Soft. Bowel sounds are positive in all 4 quadrants. Nontender, nondistended. No masses felt. No hepatosplenomegaly.  NEUROLOGIC: Awake and alert, oriented to place and person. The patient was following a few verbal commands only. She is weak in all 4 extremities. Reflexes are 2+. Sensory is intact. She could follow verbal  commands regarding cranial nerve examination. Touch sensation on the face is intact. Motor and sensory are intact in the face. Angle of mouth is mildly deviated to side. No obliteration of the nasolabial fold.  PSYCHIATRIC: Mood and affect  cannot be determined as the patient is demented.  SKIN: Warm to touch. Slightly decreased turgor. No rashes or lesions are noticed.   LABORATORIES AND IMAGING STUDIES: A 12-lead EKG has revealed normal sinus rhythm at 86 beats per minute, normal PR and QRS intervals. Nonspecific ST-T wave changes are noticed. CT of head has revealed diffuse cerebral atrophy with hypoattenuation in the periventricular white matter compatible with severe small vessel ischemia. No hemorrhage or cortical infarct. WBC 11.7, hemoglobin 13.7, hematocrit is 39.6, platelets 259. Glucose 111, BUN 10, creatinine 0.90, sodium 129, potassium 4.0, chloride 96, CO2 27, serum calcium 9.4.   ASSESSMENT AND PLAN: An 81 year old Caucasian female presenting to the ER with complaints of slurred speech which started at 7:00 a.m. yesterday and difficulty in walking and frequent falls since yesterday evening.   1. Dysarthria with unsteady gait, probably from ischemic stroke. Will admit the patient to telemetry. Will give her aspirin and statin.  MRI of the brain, carotid Dopplers and 2-D echocardiogram will be obtained. PT in a.m., and the patient will get swallow elevation by speech therapy.  2. Hyponatremia. IV fluids, will give her 1 liter of normal saline at a slower rate and recheck sodium.  3. Worsening of the dementia. Case management is consulted regarding discharge planning. 4. Hypertension with elevated blood pressure. Will titrate her home medications. 5. Diabetes mellitus. The patient is on insulin sliding scale.  6. Will provide the patient gastrointestinal and deep vein thrombosis prophylaxis.  CODE STATUS: The patient's code status is DNR.   TOTAL TIME SPENT ON ADMISSION: 50 minutes.    ____________________________ Ramonita LabAruna Mozell Haber, MD ag:OSi D: 09/26/2012 06:00:52 ET T: 09/26/2012 06:42:37 ET JOB#: [id number]  cc: Ramonita LabAruna Arbell Wycoff, MD, <Dictator> Ramonita LabARUNA Tenicia Gural MD ELECTRONICALLY SIGNED 09/29/2012 22:36

## 2014-11-14 ENCOUNTER — Emergency Department: Payer: Medicare Other

## 2014-11-14 ENCOUNTER — Emergency Department
Admission: EM | Admit: 2014-11-14 | Discharge: 2014-11-14 | Disposition: A | Discharge status: Home / Self Care | Payer: Medicare Other | Attending: Emergency Medicine | Admitting: Emergency Medicine

## 2014-11-14 ENCOUNTER — Encounter: Payer: Self-pay | Admitting: *Deleted

## 2014-11-14 DIAGNOSIS — Z7982 Long term (current) use of aspirin: Secondary | ICD-10-CM | POA: Insufficient documentation

## 2014-11-14 DIAGNOSIS — R531 Weakness: Secondary | ICD-10-CM | POA: Diagnosis not present

## 2014-11-14 DIAGNOSIS — N390 Urinary tract infection, site not specified: Secondary | ICD-10-CM | POA: Insufficient documentation

## 2014-11-14 DIAGNOSIS — Z79899 Other long term (current) drug therapy: Secondary | ICD-10-CM | POA: Diagnosis not present

## 2014-11-14 DIAGNOSIS — R404 Transient alteration of awareness: Secondary | ICD-10-CM | POA: Diagnosis not present

## 2014-11-14 DIAGNOSIS — Z794 Long term (current) use of insulin: Secondary | ICD-10-CM | POA: Insufficient documentation

## 2014-11-14 DIAGNOSIS — R4182 Altered mental status, unspecified: Secondary | ICD-10-CM | POA: Diagnosis present

## 2014-11-14 LAB — URINALYSIS COMPLETE WITH MICROSCOPIC (ARMC ONLY)
Bilirubin Urine: NEGATIVE
Glucose, UA: NEGATIVE mg/dL
Ketones, ur: NEGATIVE mg/dL
NITRITE: NEGATIVE
PH: 5 (ref 5.0–8.0)
Protein, ur: 30 mg/dL — AB
Specific Gravity, Urine: 1.009 (ref 1.005–1.030)

## 2014-11-14 LAB — COMPREHENSIVE METABOLIC PANEL
ALK PHOS: 57 U/L (ref 38–126)
ALT: 17 U/L (ref 14–54)
AST: 25 U/L (ref 15–41)
Albumin: 3.8 g/dL (ref 3.5–5.0)
Anion gap: 8 (ref 5–15)
BUN: 21 mg/dL — ABNORMAL HIGH (ref 6–20)
CO2: 24 mmol/L (ref 22–32)
Calcium: 9.8 mg/dL (ref 8.9–10.3)
Chloride: 104 mmol/L (ref 101–111)
Creatinine, Ser: 1.16 mg/dL — ABNORMAL HIGH (ref 0.44–1.00)
GFR, EST AFRICAN AMERICAN: 47 mL/min — AB (ref >60)
GFR, EST NON AFRICAN AMERICAN: 40 mL/min — AB (ref >60)
GLUCOSE: 136 mg/dL — AB (ref 65–99)
POTASSIUM: 4.7 mmol/L (ref 3.5–5.1)
SODIUM: 136 mmol/L (ref 135–145)
Total Bilirubin: 0.4 mg/dL (ref 0.3–1.2)
Total Protein: 7.8 g/dL (ref 6.5–8.1)

## 2014-11-14 LAB — PROTIME-INR
INR: 1.04
PROTHROMBIN TIME: 13.8 s (ref 11.4–15.0)

## 2014-11-14 LAB — CBC
HCT: 37.1 % (ref 35.0–47.0)
HEMOGLOBIN: 12.4 g/dL (ref 12.0–16.0)
MCH: 30 pg (ref 26.0–34.0)
MCHC: 33.3 g/dL (ref 32.0–36.0)
MCV: 90.1 fL (ref 80.0–100.0)
PLATELETS: 250 10*3/uL (ref 150–440)
RBC: 4.12 MIL/uL (ref 3.80–5.20)
RDW: 14.5 % (ref 11.5–14.5)
WBC: 8 10*3/uL (ref 3.6–11.0)

## 2014-11-14 LAB — DIFFERENTIAL
Basophils Absolute: 0.1 10*3/uL (ref 0–0.1)
Basophils Relative: 1 %
EOS ABS: 0.5 10*3/uL (ref 0–0.7)
Eosinophils Relative: 6 %
LYMPHS PCT: 22 %
Lymphs Abs: 1.8 10*3/uL (ref 1.0–3.6)
Monocytes Absolute: 0.9 10*3/uL (ref 0.2–0.9)
Monocytes Relative: 12 %
Neutro Abs: 4.7 10*3/uL (ref 1.4–6.5)
Neutrophils Relative %: 59 %

## 2014-11-14 LAB — APTT: aPTT: 25 seconds (ref 24–36)

## 2014-11-14 MED ORDER — CEPHALEXIN 500 MG PO CAPS: 500.0000 mg | ORAL_CAPSULE | Freq: Four times a day (QID) | ORAL | Status: DC

## 2014-11-14 MED ORDER — SODIUM CHLORIDE 0.9 % IV BOLUS (SEPSIS)
500.0000 mL | Freq: Once | INTRAVENOUS | Status: AC
  Administered 2014-11-14: 500 mL via INTRAVENOUS

## 2014-11-14 MED ORDER — CEPHALEXIN 500 MG PO CAPS
500.0000 mg | ORAL_CAPSULE | Freq: Once | ORAL | Status: AC
  Administered 2014-11-14: 500 mg via ORAL
  Filled 2014-11-14: qty 1

## 2014-11-14 MED ORDER — DEXTROSE 5 % IV SOLN
1.0000 g | Freq: Once | INTRAVENOUS | Status: AC
  Administered 2014-11-14: 1 g via INTRAVENOUS
  Filled 2014-11-14: qty 10

## 2014-11-14 NOTE — ED Provider Notes (Signed)
[facility] Emergency Department Provider Note  ____________________________________________  Time seen: 1515  I have reviewed the triage vital signs and the nursing notes.  History Limited due to the patient's altered status and decreased verbal ability. She does respond to simple questions. The family provides remainder of the information.  HISTORY  Chief Complaint Altered Mental Status     HPI Melissa Robbins is a 81 y.o. female who resides at [facility], nursing facility. Her birthday is tomorrow and the family came to pick her up from [facility] for a party celebration. When the family picked the patient up from [facility], they said she appearedfatigued and weak. They brought her to the area for the party and this weakness continued. She is not speaking as clearly as she usually does.  The patient does appear somnolent, fatigued, here in the emergency department, but she is responsive to questions and follows commands. She reports that she does not feel well, but she denies having any pain. She has no specific complaint other then weakness.  After the initial part of the interview and exam, while speaking about the possibility of a urinary tract infection, the family tells me that she was diagnosed with a urinary tract infection recently. The family was able to call over to [facility] to find out of a culture had been done, but none had been performed.   Past Medical History  Diagnosis Date  . Diabetes     There are no active problems to display for this patient.   History reviewed. No pertinent past surgical history.  Current Outpatient Rx  Name  Route  Sig  Dispense  Refill  . acetaminophen (TYLENOL) 325 MG tablet   Oral   Take 325 mg by mouth 3 (three) times daily as needed.         Marland Kitchen amLODipine (NORVASC) 5 MG tablet   Oral   Take 5 mg by mouth daily.         Marland Kitchen aspirin 81 MG tablet   Oral   Take 81 mg by mouth  daily.         . Cholecalciferol (VITAMIN D3) 50000 UNITS CAPS   Oral   Take 1 capsule by mouth every 30 (thirty) days. Taken on the 1st of the month         . glimepiride (AMARYL) 2 MG tablet   Oral   Take 2 mg by mouth 2 (two) times daily.         Marland Kitchen guaiFENesin (ROBITUSSIN) 100 MG/5ML SOLN   Oral   Take 10 mLs by mouth every 6 (six) hours as needed for cough.         . insulin glargine (LANTUS) 100 UNIT/ML injection   Subcutaneous   Inject 5-31 Units into the skin 2 (two) times daily. 31 units in the morning and 5 units at bedtime         . insulin lispro (HUMALOG) 100 UNIT/ML injection   Subcutaneous   Inject 2-10 Units into the skin 4 (four) times daily - after meals and at bedtime. Blood sugar 201-250= 2 units 251-300= 4 units 301-350= 6 units 351-400= 8 units 401-450= 10 units         . levothyroxine (SYNTHROID, LEVOTHROID) 150 MCG tablet   Oral   Take 150 mcg by mouth daily before breakfast.         . metFORMIN (GLUCOPHAGE-XR) 750 MG 24 hr tablet   Oral   Take 750 mg  by mouth 2 (two) times daily.          . Multiple Vitamin (MULTIVITAMIN) tablet   Oral   Take 1 tablet by mouth daily.         . pravastatin (PRAVACHOL) 40 MG tablet   Oral   Take 40 mg by mouth at bedtime.         . promethazine (PHENERGAN) 12.5 MG tablet   Oral   Take 12.5 mg by mouth every 8 (eight) hours as needed for nausea or vomiting.         Marland Kitchen saccharomyces boulardii (FLORASTOR) 250 MG capsule   Oral   Take 250 mg by mouth 2 (two) times daily.         . sennosides-docusate sodium (SENOKOT-S) 8.6-50 MG tablet   Oral   Take 1 tablet by mouth 2 (two) times daily.         . vitamin B-12 (CYANOCOBALAMIN) 1000 MCG tablet   Oral   Take 1,000 mcg by mouth daily.         Marland Kitchen alendronate (FOSAMAX) 70 MG tablet   Oral   Take 70 mg by mouth once a week. Take with a full glass of water on an empty stomach.         Marland Kitchen amLODipine (NORVASC) 10 MG tablet   Oral    Take 10 mg by mouth daily.         Marland Kitchen glimepiride (AMARYL) 4 MG tablet   Oral   Take 4 mg by mouth daily with breakfast.         . HYDROcodone-acetaminophen (NORCO/VICODIN) 5-325 MG per tablet   Oral   Take 1 tablet by mouth every 6 (six) hours as needed for moderate pain.         Marland Kitchen insulin aspart (NOVOLOG) 100 UNIT/ML injection   Subcutaneous   Inject into the skin 3 (three) times daily before meals.         Marland Kitchen omeprazole (PRILOSEC) 20 MG capsule   Oral   Take 20 mg by mouth daily.           Allergies Zinc oxide  History reviewed. No pertinent family history.  Social History History  Substance Use Topics  . Smoking status: Never Smoker   . Smokeless tobacco: Not on file  . Alcohol Use: No    Review of Systems  Patient is weak and has a little bit of limited communication, but denies any focal ailment.  Constitutional: Negative for fever. Notable for general fatigue and malaise. Cardiovascular: Negative for chest pain. Respiratory: Negative for shortness of breath. Gastrointestinal: Negative for abdominal pain, vomiting and diarrhea. Genitourinary: Patient denies dysuria. She has been recently diagnosed with UTI. Musculoskeletal: No myalgias or injuries. Skin: Negative for rash.   10-point ROS otherwise negative.  ____________________________________________   PHYSICAL EXAM:  VITAL SIGNS: ED Triage Vitals  Enc Vitals Group     BP 11/14/14 1346 115/71 mmHg     Pulse Rate 11/14/14 1346 80     Resp 11/14/14 1346 20     Temp --      Temp src --      SpO2 11/14/14 1346 97 %     Weight 11/14/14 1346 130 lb (58.968 kg)     Height 11/14/14 1346 5\' 2"  (1.575 m)     Head Cir --      [facility] --      Pain Score --      Pain Loc --  Pain Edu? --      Excl. in GC? --     Constitutional: Arousable, responds to verbal stimuli, answers simple questions. Appears generally weak. Some limited speech due to this weakness. ENT   Head: Normocephalic  and atraumatic.   Nose: No congestion/rhinnorhea.   Mouth/Throat: Mucous membranes are moist. Cardiovascular: Normal rate at 71, regular rhythm, no murmur noted Respiratory:  Normal respiratory effort, no tachypnea.    Breath sounds are clear and equal bilaterally.  Gastrointestinal: Soft. Mild tenderness through the lower abdomen.  No distention.  Back: No muscle spasm, no tenderness, no CVA tenderness. Musculoskeletal: No deformity noted. Nontender with normal range of motion in all extremities.  No noted edema. Neurologic: Appears generally weak but no focal neurologic deficit noted. She has 4 out of 5 strength in both legs and 5 over 5 strength in both arms. She responds to voice as noted above. She has normal sensation. She has a normal Babinski. Skin:  Skin is warm, dry. No rash noted.  Psychiatric: Patient appears weak. She answers simple questions. The family prompts her to see if she remembers details from earlier. She has not able to answer detailed questions of that sort at this time. ____________________________________________    LABS (pertinent positives/negatives)  Labs Reviewed  COMPREHENSIVE METABOLIC PANEL - Abnormal; Notable for the following:    Glucose, Bld 136 (*)    BUN 21 (*)    Creatinine, Ser 1.16 (*)    GFR calc non Af Amer 40 (*)    GFR calc Af Amer 47 (*)    All other components within normal limits  URINALYSIS COMPLETEWITH MICROSCOPIC ([facility] ONLY) - Abnormal; Notable for the following:    Color, Urine YELLOW (*)    APPearance TURBID (*)    Hgb urine dipstick 2+ (*)    Protein, ur 30 (*)    Leukocytes, UA 3+ (*)    Bacteria, UA MANY (*)    Squamous Epithelial / LPF 0-5 (*)    All other components within normal limits  URINE CULTURE  PROTIME-INR  APTT  CBC  DIFFERENTIAL  CBG MONITORING, ED     ____________________________________________   EKG  ED ECG REPORT I, Naphtali Riede W, the attending physician, personally viewed and  interpreted this ECG.   Date: 11/14/2014  EKG Time: 14:27  Rate: 75  Rhythm: sinus rhythm with first-degree block  Axis: Normal  Intervals: PR of 224, first-degree block  ST&T Change: None noted   ____________________________________________    RADIOLOGY  CT head: IMPRESSION: No acute intracranial findings.  Stable cerebral atrophy, chronic small vessel disease, and old right basal ganglia lacunar infarct.    ____________________________________________   INITIAL IMPRESSION / ASSESSMENT AND PLAN / ED COURSE  Pertinent labs & imaging results that were available during my care of the patient were reviewed by me and considered in my medical decision making (see chart for details).  81 year old female with acute general weakness. Her vital signs are all reasonable. Her blood tests are overall good with no significant change in renal function or signs of dehydration.  On exam, she does have some tenderness in the lower abdomen. A urinary tract infection is suspect for causing this discomfort as well as her general weakness. A urinalysis is pending.   ----------------------------------------- 6:32 PM on 11/14/2014 -----------------------------------------  The patient's urine does show a urinary tract infection with white blood cells too numerous to count. She has are even treated with Rocephin which was ordered and started as soon as  we had obtained the urine sample. She has received 500 mL of normal saline IV. At this time, she is sitting upright in bed and eating food that the family brought. She appears more alert and stable. Her vital signs are all within normal limits.  I will discharge her from the emergency department to return to [facility]. We will prescribe Keflex for ongoing treatment. The urine is being cultured.  ____________________________________________   FINAL CLINICAL IMPRESSION(S) / ED DIAGNOSES  Final diagnoses:  Transient alteration of awareness   General weakness  UTI (lower urinary tract infection)      Darien Ramus, MD 11/14/14 [facility] Fruit

## 2014-11-14 NOTE — Discharge Instructions (Signed)
CT scan of head and the blood work performed today all looked overall good. The urine showed white blood cells too numerous to count. This urine is being cultured. You have been treated with Rocephin, 1 g, IV. Began Keflex tomorrow, Monday, August 1. Follow-up with your regular doctor in 5-8 days. Return to the emergency department if you feel worse, if there is ongoing or worsening weakness, or give other urgent concerns.  Urinary Tract Infection A urinary tract infection (UTI) can occur any place along the urinary tract. The tract includes the kidneys, ureters, bladder, and urethra. A type of germ called bacteria often causes a UTI. UTIs are often helped with antibiotic medicine.  HOME CARE   If given, take antibiotics as told by your doctor. Finish them even if you start to feel better.  Drink enough fluids to keep your pee (urine) clear or pale yellow.  Avoid tea, drinks with caffeine, and bubbly (carbonated) drinks.  Pee often. Avoid holding your pee in for a long time.  Pee before and after having sex (intercourse).  Wipe from front to back after you poop (bowel movement) if you are a woman. Use each tissue only once. GET HELP RIGHT AWAY IF:   You have back pain.  You have lower belly (abdominal) pain.  You have chills.  You feel sick to your stomach (nauseous).  You throw up (vomit).  Your burning or discomfort with peeing does not go away.  You have a fever.  Your symptoms are not better in 3 days. MAKE SURE YOU:   Understand these instructions.  Will watch your condition.  Will get help right away if you are not doing well or get worse. Document Released: 09/19/2007 Document Revised: 12/26/2011 Document Reviewed: 11/01/2011 [facility] Patient Information 2015 [place], [place]. This information is not intended to replace advice given to you by your health care provider. Make sure you discuss any questions you have with your health care provider.

## 2014-11-14 NOTE — ED Notes (Signed)
Per EMS, [facility] for a party for the pt, pt was lethargic at this time.  The [facility], pt remained lethargic, only responds to painful stimuli, verbal is not understandable.

## 2014-11-16 LAB — URINE CULTURE

## 2015-01-26 IMAGING — CT CT PELVIS W/O CM
2 of 3 series · 17 of 46 positions shown, 19 images · non-contrast
Comparison: None available.

CLINICAL DATA: Right hip and pelvic pain. Abnormal x-ray with
possible fracture.

EXAM:
CT PELVIS WITHOUT CONTRAST
TECHNIQUE: Multidetector CT imaging of the pelvis was performed following the
standard protocol without intravenous contrast.

[Series 2: bone windows · axial · 0.70mm/px · z∈[-927,-705]mm · 14 of 86 slices shown, 16 images]
[im 6/86  soft-tissue]
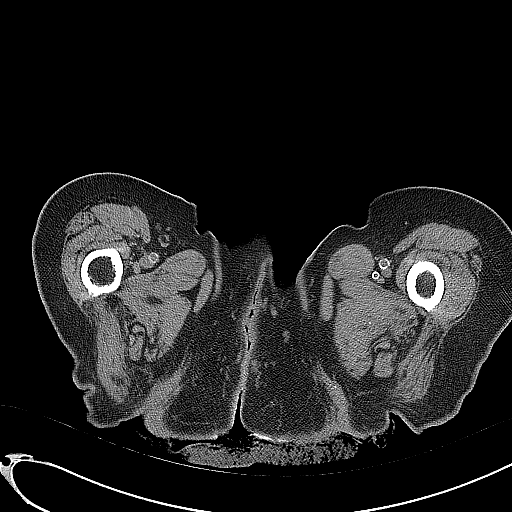
[im 6/86  bone]
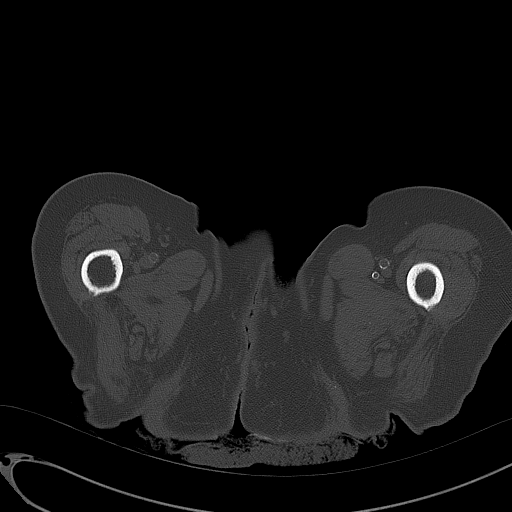
[im 11/86  soft-tissue]
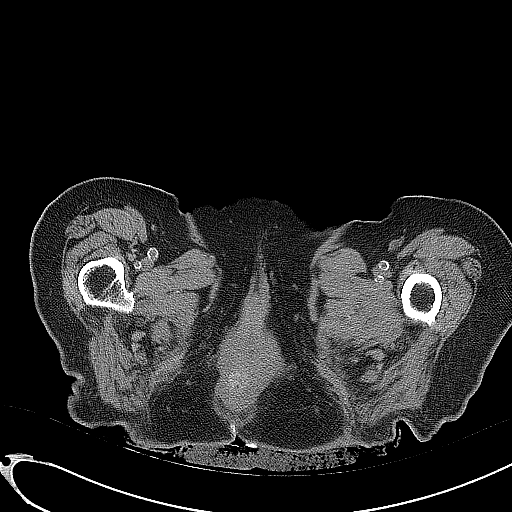
[im 17/86  soft-tissue]
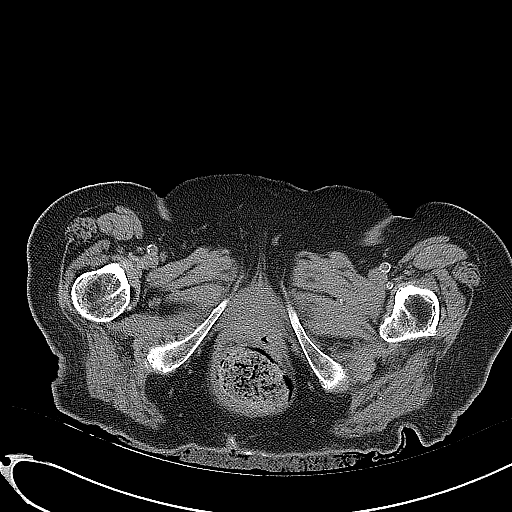
[im 22/86  soft-tissue]
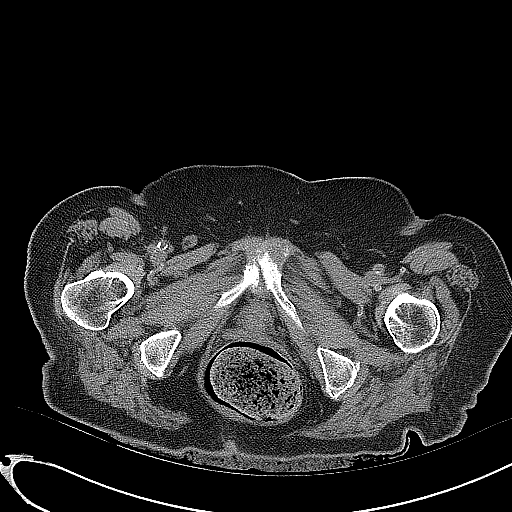
[im 28/86  soft-tissue]
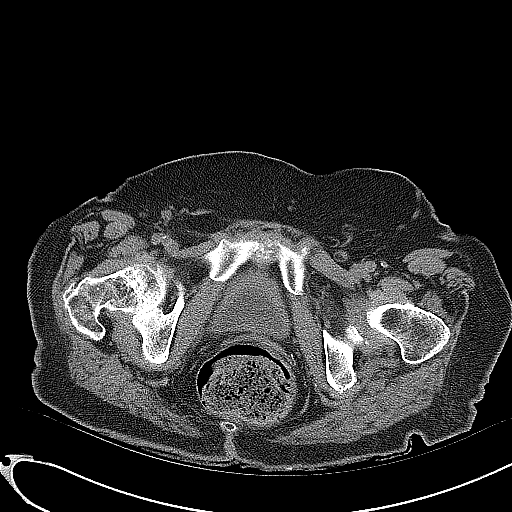
[im 33/86  soft-tissue]
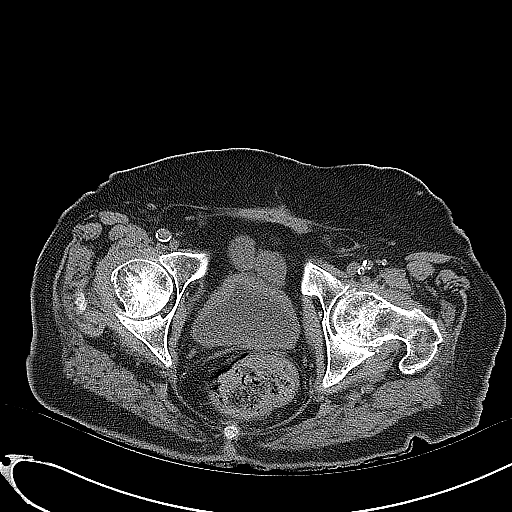
[im 39/86  soft-tissue]
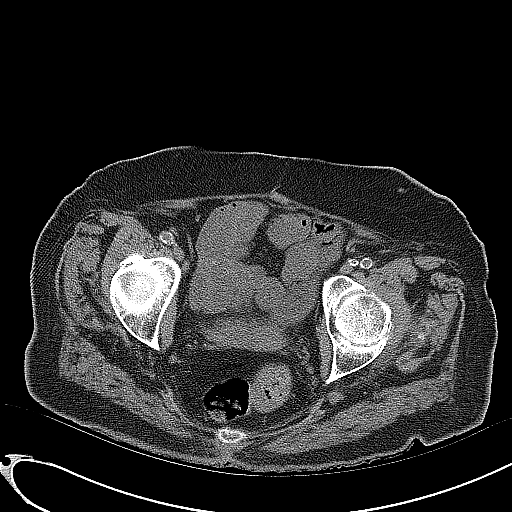
[im 47/86  soft-tissue]
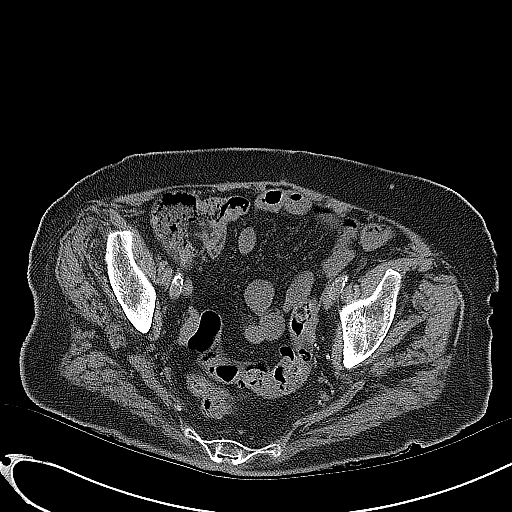
[im 53/86  soft-tissue]
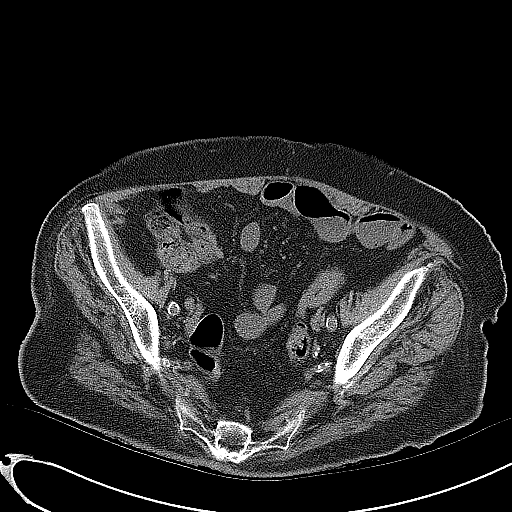
[im 53/86  bone]
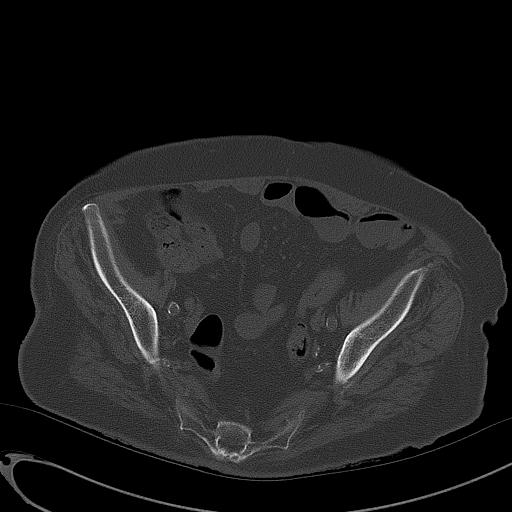
[im 58/86  soft-tissue]
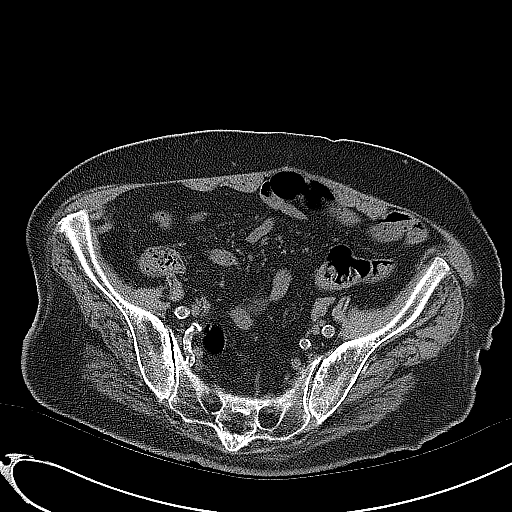
[im 64/86  soft-tissue]
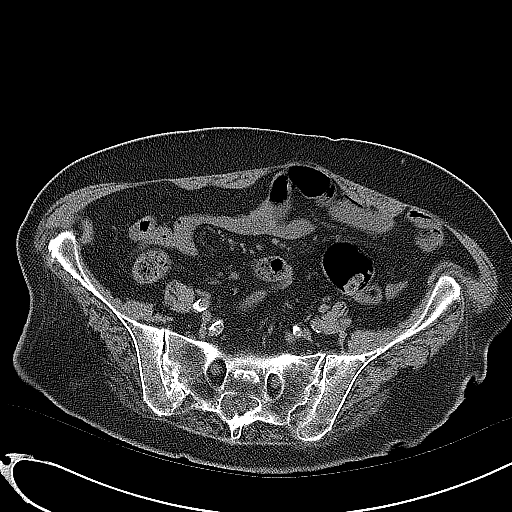
[im 69/86  soft-tissue]
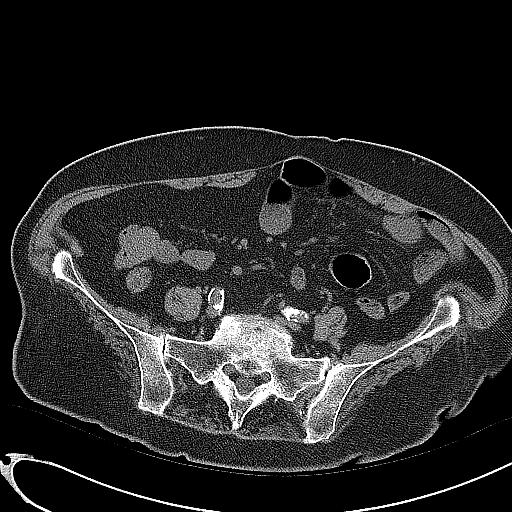
[im 75/86  soft-tissue]
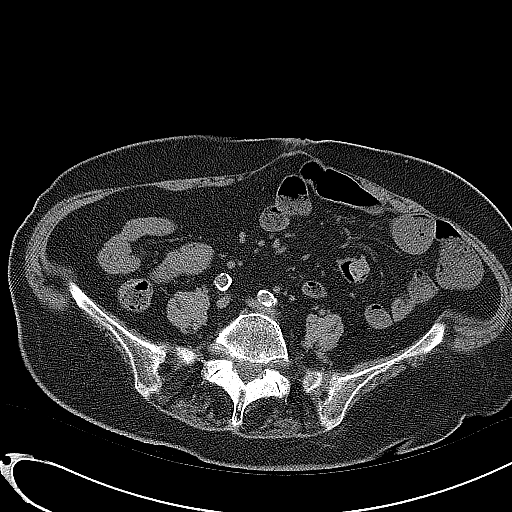
[im 80/86  soft-tissue]
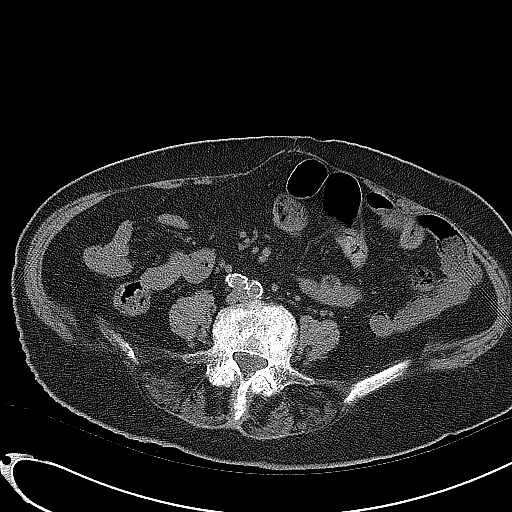

[Series 4: coronal · coronal · 0.52mm/px · 3 of 130 slices shown]
[im 44/130  soft-tissue]
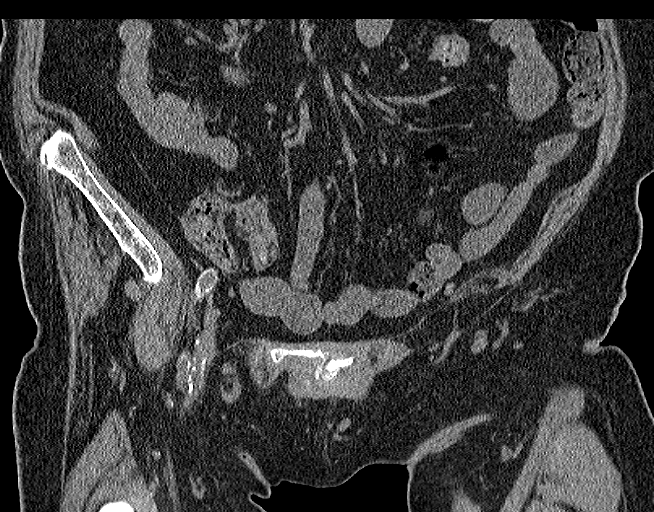
[im 58/130  soft-tissue]
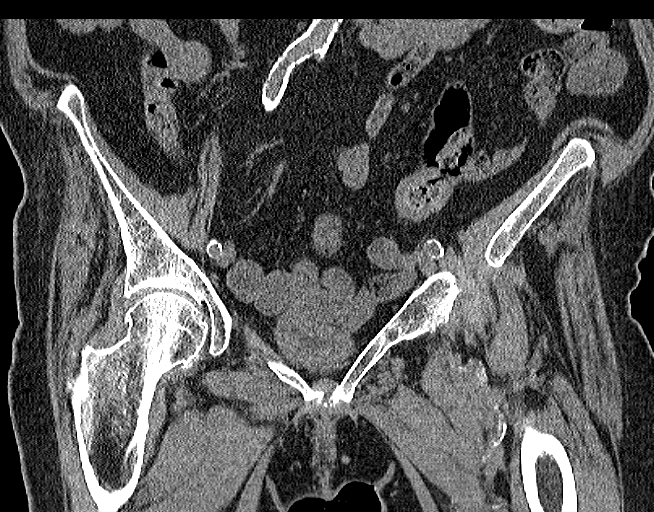
[im 72/130  soft-tissue]
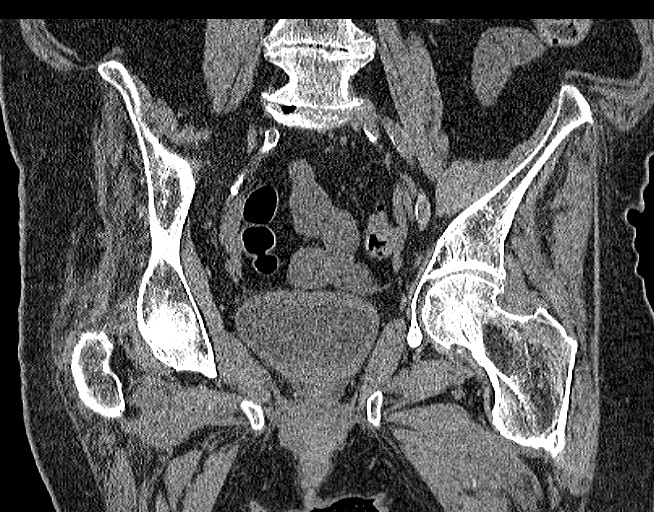

[17 of 46 positions shown; findings below may reference images not displayed]

FINDINGS: No acute bony abnormality. No evidence of acute pelvic or hip
fracture. There are degenerative changes at the pubic symphysis. SI
joints are symmetric. Degenerative changes in the visualized lower
lumbar spine.

Moderate stool in the rectosigmoid colon. Scattered sigmoid
diverticula without active diverticulitis. Prior hysterectomy. No
free fluid, free air or adenopathy. Visualized distal aorta and
iliac vessels are heavily calcified, non aneurysmal.
IMPRESSION: No evidence of pelvic or proximal femoral fracture.

## 2015-07-14 ENCOUNTER — Other Ambulatory Visit
Admission: RE | Admit: 2015-07-14 | Discharge: 2015-07-14 | Disposition: A | Discharge status: Home / Self Care | Payer: Medicare Other | Source: Other Acute Inpatient Hospital | Attending: Family Medicine | Admitting: Family Medicine

## 2015-07-14 DIAGNOSIS — R509 Fever, unspecified: Secondary | ICD-10-CM | POA: Diagnosis present

## 2015-07-14 DIAGNOSIS — R05 Cough: Secondary | ICD-10-CM | POA: Diagnosis present

## 2015-07-14 LAB — RAPID INFLUENZA A&B ANTIGENS: Influenza B (ARMC): NEGATIVE

## 2015-07-14 LAB — RAPID INFLUENZA A&B ANTIGENS (ARMC ONLY): INFLUENZA A (ARMC): NEGATIVE

## 2015-08-23 IMAGING — CT CT HEAD W/O CM
1 series · 16 of 30 positions shown, 20 images · non-contrast
Comparison: 09/26/2012

CLINICAL DATA: Altered mental status since this morning. Decreased
level of consciousness.

EXAM:
CT HEAD WITHOUT CONTRAST
TECHNIQUE: Contiguous axial images were obtained from the base of the skull
through the vertex without intravenous contrast.

[Series 2: head wo · axial · 0.40mm/px · z∈[-49,+86]mm · 16 of 30 slices shown, 20 images]
[im 2/30  brain]
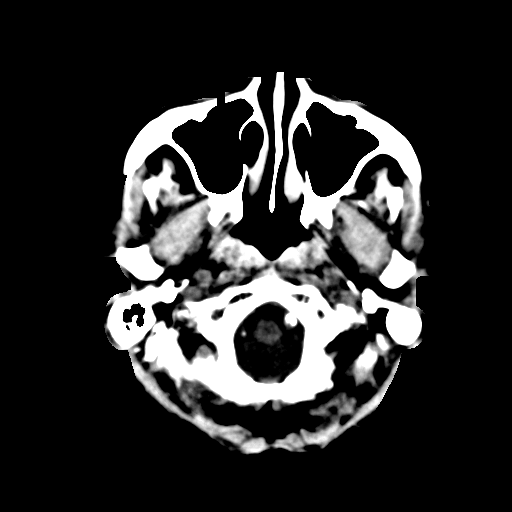
[im 2/30  bone]
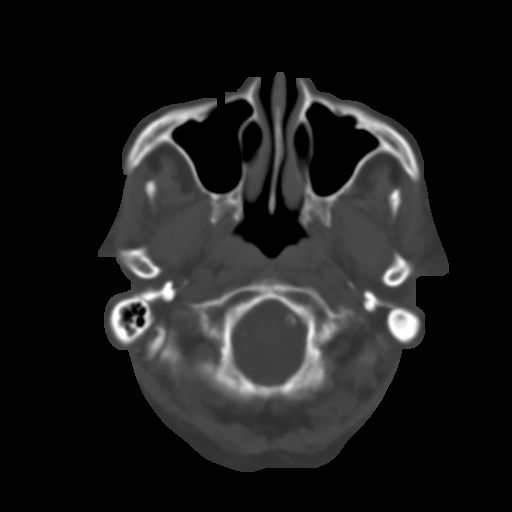
[im 4/30  brain]
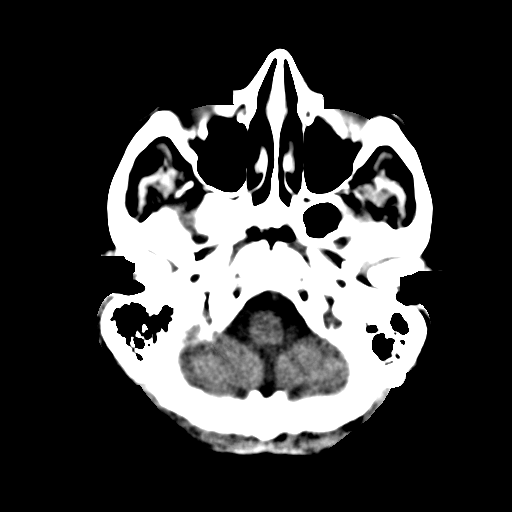
[im 6/30  brain]
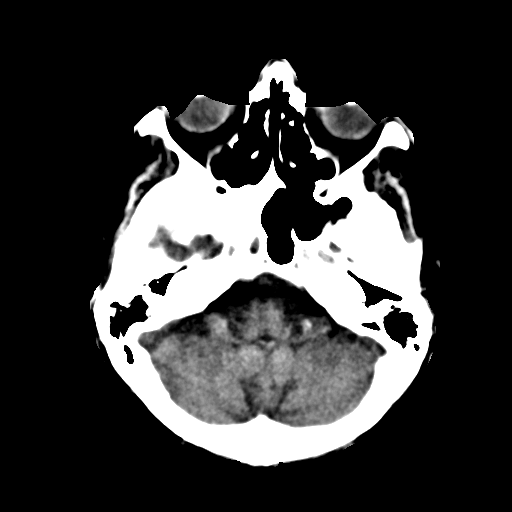
[im 8/30  brain]
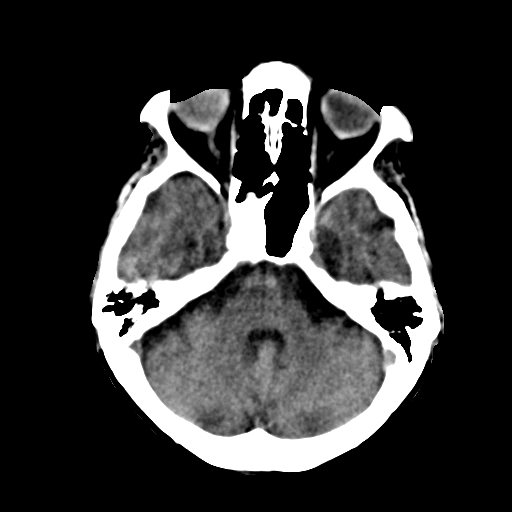
[im 9/30  brain]
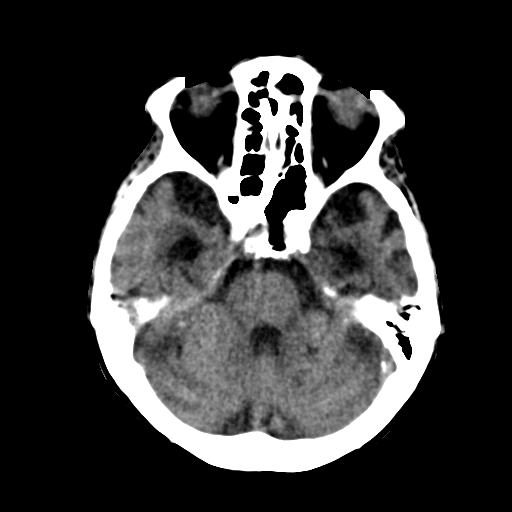
[im 9/30  bone]
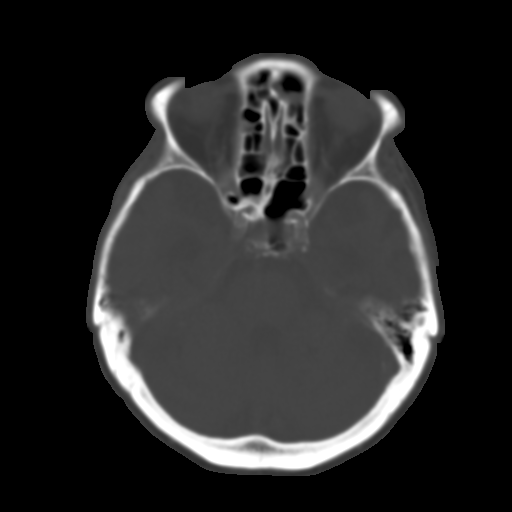
[im 11/30  brain]
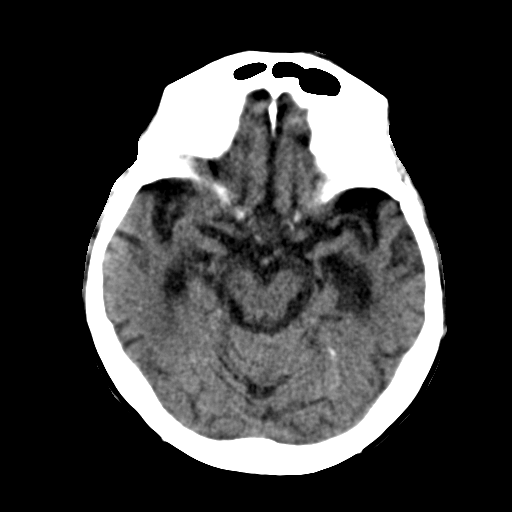
[im 13/30  brain]
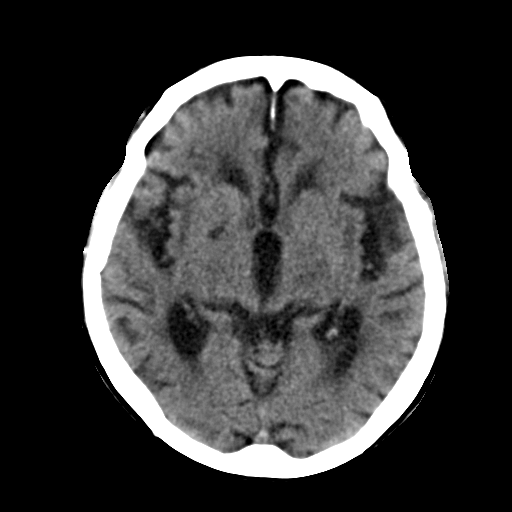
[im 15/30  brain]
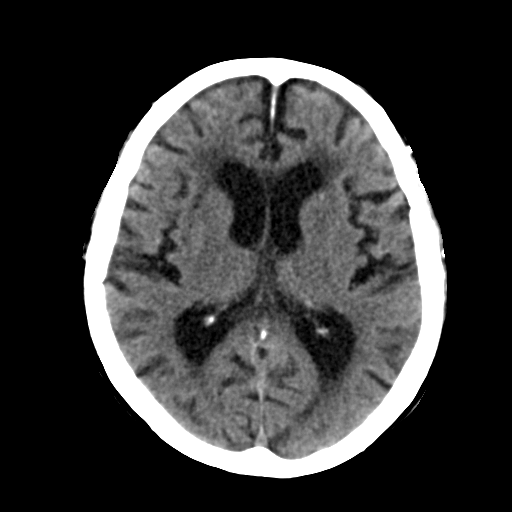
[im 16/30  brain]
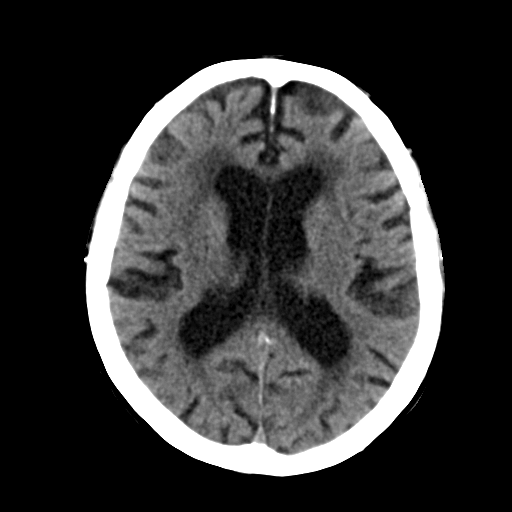
[im 16/30  bone]
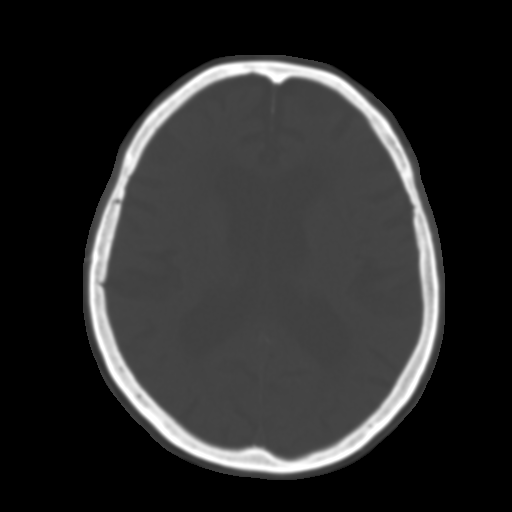
[im 18/30  brain]
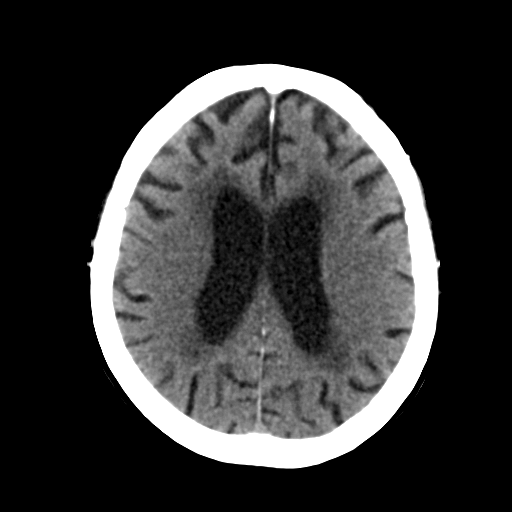
[im 20/30  brain]
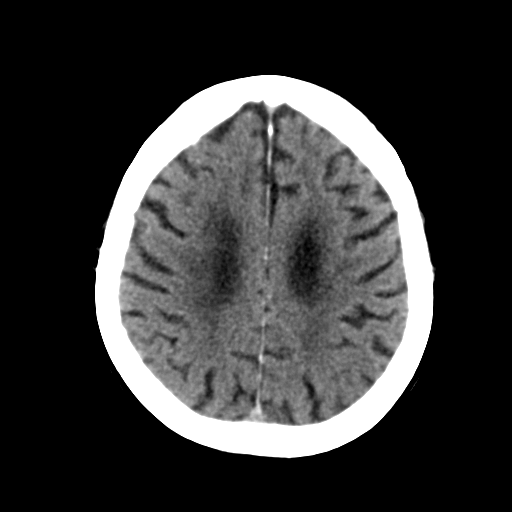
[im 22/30  brain]
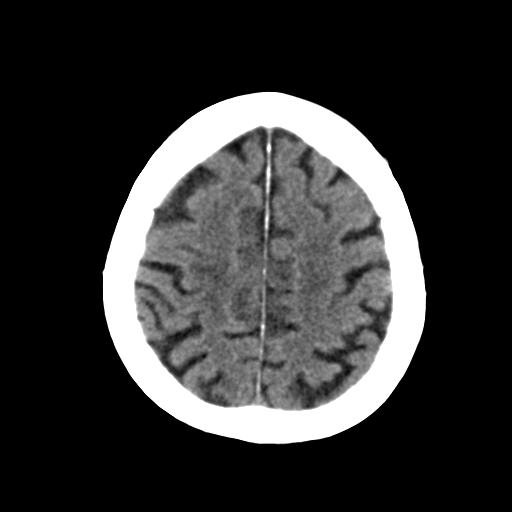
[im 23/30  brain]
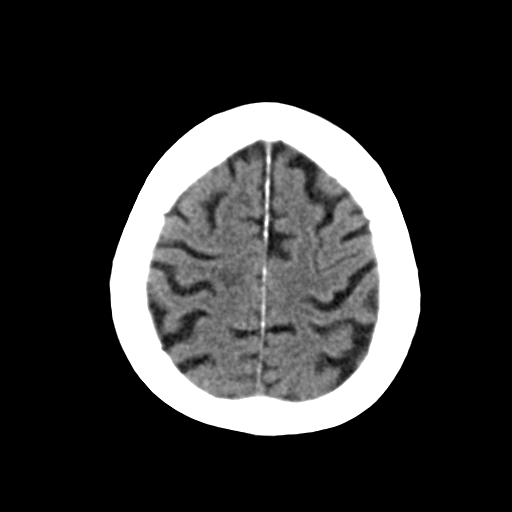
[im 23/30  bone]
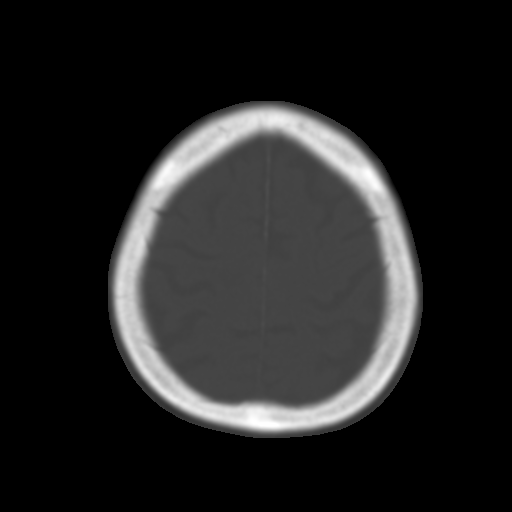
[im 25/30  brain]
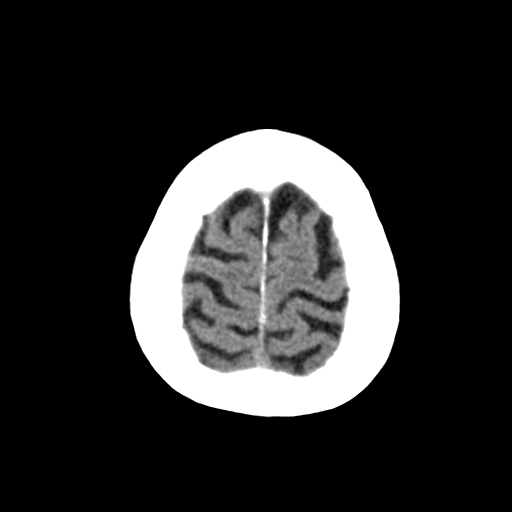
[im 27/30  brain]
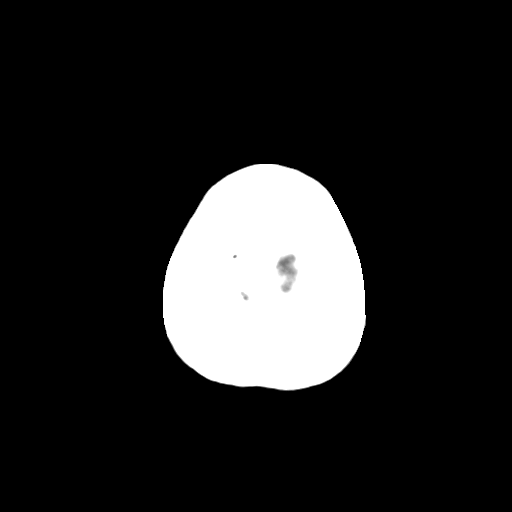
[im 29/30  brain]
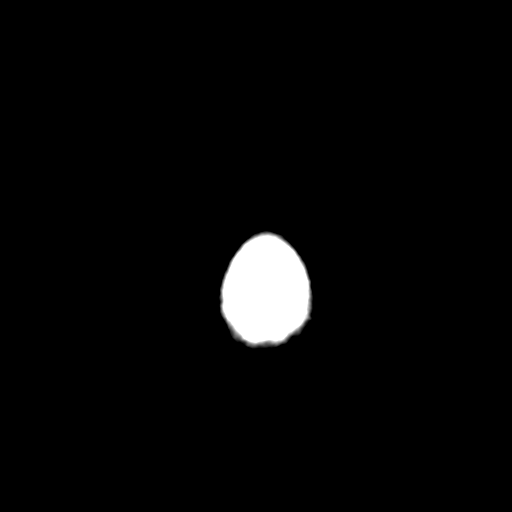

[16 of 30 positions shown; findings below may reference images not displayed]

FINDINGS: There is no evidence of intracranial hemorrhage, brain edema, or
other signs of acute infarction. There is no evidence of
intracranial mass lesion or mass effect. No abnormal extraaxial
fluid collections are identified.

Moderate cerebral atrophy and chronic small vessel disease are
stable in appearance. Old lacunar infarct again seen involving the
right lentiform nucleus. Ventricles are stable in size. No skull
abnormality identified.
IMPRESSION: No acute intracranial findings.

Stable cerebral atrophy, chronic small vessel disease, and old right
basal ganglia lacunar infarct.

## 2017-06-04 ENCOUNTER — Other Ambulatory Visit (INDEPENDENT_AMBULATORY_CARE_PROVIDER_SITE_OTHER): Payer: Self-pay | Admitting: Vascular Surgery

## 2017-06-04 DIAGNOSIS — M869 Osteomyelitis, unspecified: Secondary | ICD-10-CM

## 2017-06-05 ENCOUNTER — Ambulatory Visit (INDEPENDENT_AMBULATORY_CARE_PROVIDER_SITE_OTHER): Payer: Medicare Other | Admitting: Vascular Surgery

## 2017-06-05 ENCOUNTER — Ambulatory Visit (INDEPENDENT_AMBULATORY_CARE_PROVIDER_SITE_OTHER): Payer: Medicare Other

## 2017-06-05 ENCOUNTER — Encounter (INDEPENDENT_AMBULATORY_CARE_PROVIDER_SITE_OTHER): Payer: Self-pay | Admitting: Vascular Surgery

## 2017-06-05 VITALS — BP 114/63 | HR 75 | Resp 16 | Ht 65.0 in | Wt 135.0 lb

## 2017-06-05 DIAGNOSIS — I10 Essential (primary) hypertension: Secondary | ICD-10-CM

## 2017-06-05 DIAGNOSIS — I739 Peripheral vascular disease, unspecified: Secondary | ICD-10-CM

## 2017-06-05 DIAGNOSIS — M869 Osteomyelitis, unspecified: Secondary | ICD-10-CM | POA: Diagnosis not present

## 2017-06-05 DIAGNOSIS — E785 Hyperlipidemia, unspecified: Secondary | ICD-10-CM | POA: Insufficient documentation

## 2017-06-05 DIAGNOSIS — E118 Type 2 diabetes mellitus with unspecified complications: Secondary | ICD-10-CM | POA: Diagnosis not present

## 2017-06-05 NOTE — Progress Notes (Signed)
Subjective:    Patient ID: Melissa MiresKathryn Robbins, female    DOB: 03/09/1922, 84 y.o.   MRN: [id number] Chief Complaint  Patient presents with  . New Patient (Initial Visit)    ref Alberteen Spindleline for ABI   Presents as a new patient referred by Dr. Alberteen Spindleline for evaluation of bilateral second toe ulceration with osteomyelitis on x-ray.  The patient has dementia and is a poor historian.  The patient was seen with her daughter who provided the patient's past medical history/information.  The patient has had ulcerations noted to the tip of the bilateral second toes for over three months.  The patient has been receiving local wound care from the nursing home wound nurse.  The patient has also been receiving wound care from podiatry with minimal improvement.  X-rays of the bilateral second toe suggestive of osteomyelitis.  The daughter notes that the discomfort to the left lower extremity seems to be worse when compared to the right. The patient does not ambulate however the daughter states that when you touch her feet the patient will start "screaming".  There are no any additional ulcerations noted to the lower extremity.  The daughter states that podiatry may want to amputate.  The patient underwent an ABI which was known unable to obtain reliable brachial ankle indices due to noncompressible vessels most likely due to medial calcification.  Test was limited due to patient intolerance.  Daughter denies any recent fever, nausea or vomiting.   Review of Systems  Constitutional: Negative.   HENT: Negative.   Eyes: Negative.   Respiratory: Negative.   Cardiovascular: Negative.   Gastrointestinal: Negative.   Endocrine: Negative.   Genitourinary: Negative.   Musculoskeletal: Negative.   Skin: Positive for wound.  Allergic/Immunologic: Negative.   Neurological: Negative.   Hematological: Negative.   Psychiatric/Behavioral: Negative.       Objective:   Physical Exam  Constitutional: She is oriented to person,  place, and time. She appears well-developed and well-nourished. No distress.  In wheelchair.  Resting comfortably.  HENT:  Head: Normocephalic and atraumatic.  Eyes: Conjunctivae are normal. Pupils are equal, round, and reactive to light.  Neck: Normal range of motion.  Cardiovascular: Normal rate, regular rhythm, normal heart sounds and intact distal pulses.  Pulses:      Radial pulses are 2+ on the right side, and 2+ on the left side.  Unable to palpate pedal pulses bilaterally  Pulmonary/Chest: Effort normal and breath sounds normal.  Musculoskeletal: Normal range of motion. She exhibits no edema.  Neurological: She is alert and oriented to person, place, and time.  Skin: She is not diaphoretic.  Bilateral second toes: Less than 1 cm by less than 1 cm ulceration to the tip bilaterally.  There is no infection noted.  There is no surrounding cellulitis.  There is no surrounding erythema.  There is no drainage.  Psychiatric: She has a normal mood and affect. Her behavior is normal. Judgment and thought content normal.  Vitals reviewed.  BP 114/63 (BP Location: Right Arm)   Pulse 75   Resp 16   Ht 5\' 5"  (1.651 m)   Wt 135 lb (61.2 kg)   BMI 22.47 kg/m   Past Medical History:  Diagnosis Date  . Diabetes [facility](HCC)    Social History   Socioeconomic History  . Marital status: Divorced    Spouse name: Not on file  . Number of children: Not on file  . Years of education: Not on file  . Highest education  level: Not on file  Social Needs  . Financial resource strain: Not on file  . Food insecurity - worry: Not on file  . Food insecurity - inability: Not on file  . Transportation needs - medical: Not on file  . Transportation needs - non-medical: Not on file  Occupational History  . Not on file  Tobacco Use  . Smoking status: Never Smoker  . Smokeless tobacco: Never Used  Substance and Sexual Activity  . Alcohol use: No  . Drug use: No  . Sexual activity: Not on file  Other  Topics Concern  . Not on file  Social History Narrative  . Not on file   Past Surgical History:  Procedure Laterality Date  . ABDOMINAL HYSTERECTOMY    . VARICOSE VEIN SURGERY     Family History  Problem Relation Age of Onset  . Diabetes Daughter   . Diabetes Son   . Hypertension Son    Allergies  Allergen Reactions  . Zinc Oxide     unknown      Assessment & Plan:  Presents as a new patient referred by Dr. Alberteen Spindle for evaluation of bilateral second toe ulceration with osteomyelitis on x-ray.  The patient has dementia and is a poor historian.  The patient was seen with her daughter who provided the patient's past medical history/information.  The patient has had ulcerations noted to the tip of the bilateral second toes for over three months.  The patient has been receiving local wound care from the nursing home wound nurse.  The patient has also been receiving wound care from podiatry with minimal improvement.  X-rays of the bilateral second toe suggestive of osteomyelitis.  The daughter notes that the discomfort to the left lower extremity seems to be worse when compared to the right. The patient does not ambulate however the daughter states that when you touch her feet the patient will start "screaming".  There are no any additional ulcerations noted to the lower extremity.  The daughter states that podiatry may want to amputate.  The patient underwent an ABI which was known unable to obtain reliable brachial ankle indices due to noncompressible vessels most likely due to medial calcification.  Test was limited due to patient intolerance.  Daughter denies any recent fever, nausea or vomiting.  1. PAD (peripheral artery disease) (HCC) - New Patient with multiple risk factors for peripheral artery disease Unable to palpate pedal pulses bilaterally on exam Patient with very slow healing ulcerations to the tip of the bilateral second toes Osteomyelitis seen on x-ray ABI was unreliable due  to medial calcification and patient intolerance Recommend a left lower extremity angiogram with possible intervention and attempt to assess the patient's anatomy and degree of contributing peripheral artery disease.  If appropriate, an attempt to revascularize the leg can take place at that time Procedure, risks and benefits explained to the patient and her daughter who was present They will be following up with Dr. Alberteen Spindle tomorrow.  Once they speak with him they will call our office to let us know if they would like to proceed.  2. Osteomyelitis of foot, unspecified laterality, unspecified type (HCC) - New Without appropriate blood flow will be very hard for the patient to heal any ulceration/osteomyelitis to the foot Strongly recommend undergoing bilateral angiograms however starting with the left lower extremity as his extremity is more symptomatic  3. Essential hypertension - Stable Encouraged good control as its slows the progression of atherosclerotic disease  4. Type  2 diabetes mellitus with complication, unspecified whether long term insulin use (HCC) - Stable Encouraged good control as its slows the progression of atherosclerotic disease  5. Hyperlipidemia, unspecified hyperlipidemia type - Stable Encouraged good control as its slows the progression of atherosclerotic disease  Current Outpatient Medications on File Prior to Visit  Medication Sig Dispense Refill  . acetaminophen (TYLENOL) 325 MG tablet Take 325 mg by mouth 3 (three) times daily as needed.    Marland Kitchen alendronate (FOSAMAX) 70 MG tablet Take 70 mg by mouth once a week. Take with a full glass of water on an empty stomach.    Marland Kitchen amLODipine (NORVASC) 10 MG tablet Take 10 mg by mouth daily.    . Aspirin-Calcium Carbonate (BAYER WOMENS) [phone] MG TABS Take by mouth.    . Cholecalciferol (VITAMIN D3) 50000 UNITS CAPS Take 1 capsule by mouth every 30 (thirty) days. Taken on the 1st of the month    . doxycycline (VIBRAMYCIN) 100 MG  capsule Take by mouth.    Marland Kitchen glimepiride (AMARYL) 4 MG tablet Take 4 mg by mouth daily with breakfast.    . insulin lispro (HUMALOG) 100 UNIT/ML injection Inject 2-10 Units into the skin 4 (four) times daily - after meals and at bedtime. Blood sugar 201-250= 2 units 251-300= 4 units 301-350= 6 units 351-400= 8 units 401-450= 10 units    . levothyroxine (SYNTHROID, LEVOTHROID) 150 MCG tablet Take 150 mcg by mouth daily before breakfast.    . metFORMIN (GLUCOPHAGE-XR) 750 MG 24 hr tablet Take 750 mg by mouth 2 (two) times daily.     . Multiple Vitamin (MULTIVITAMIN) tablet Take 1 tablet by mouth daily.    Marland Kitchen omeprazole (PRILOSEC) 20 MG capsule Take 20 mg by mouth daily.    . pravastatin (PRAVACHOL) 40 MG tablet Take 40 mg by mouth at bedtime.    . sennosides-docusate sodium (SENOKOT-S) 8.6-50 MG tablet Take 1 tablet by mouth 2 (two) times daily.    . vitamin B-12 (CYANOCOBALAMIN) 1000 MCG tablet Take 1,000 mcg by mouth daily.    Marland Kitchen amLODipine (NORVASC) 5 MG tablet Take 5 mg by mouth daily.    Marland Kitchen aspirin 81 MG tablet Take 81 mg by mouth daily.    . cephALEXin (KEFLEX) 500 MG capsule Take 1 capsule (500 mg total) by mouth 4 (four) times daily. (Patient not taking: Reported on 06/05/2017) 28 capsule 0  . glimepiride (AMARYL) 2 MG tablet Take 2 mg by mouth 2 (two) times daily.    Marland Kitchen guaiFENesin (ROBITUSSIN) 100 MG/5ML SOLN Take 10 mLs by mouth every 6 (six) hours as needed for cough.    Marland Kitchen HYDROcodone-acetaminophen (NORCO/VICODIN) 5-325 MG per tablet Take 1 tablet by mouth every 6 (six) hours as needed for moderate pain.    Marland Kitchen insulin aspart (NOVOLOG) 100 UNIT/ML injection Inject into the skin 3 (three) times daily before meals.    . insulin glargine (LANTUS) 100 UNIT/ML injection Inject 5-31 Units into the skin 2 (two) times daily. 31 units in the morning and 5 units at bedtime    . promethazine (PHENERGAN) 12.5 MG tablet Take 12.5 mg by mouth every 8 (eight) hours as needed for nausea or vomiting.    .  saccharomyces boulardii (FLORASTOR) 250 MG capsule Take 250 mg by mouth 2 (two) times daily.     No current facility-administered medications on file prior to visit.    There are no Patient Instructions on file for this visit. No Follow-up on file.  Brainard Highfill A Lucia Harm,  PA-C

## 2017-06-12 ENCOUNTER — Encounter (INDEPENDENT_AMBULATORY_CARE_PROVIDER_SITE_OTHER): Payer: Self-pay

## 2017-06-17 ENCOUNTER — Other Ambulatory Visit (INDEPENDENT_AMBULATORY_CARE_PROVIDER_SITE_OTHER): Payer: Self-pay | Admitting: Vascular Surgery

## 2017-06-18 ENCOUNTER — Encounter
Admission: RE | Disposition: A | Discharge status: Home / Self Care | Payer: Self-pay | Source: Ambulatory Visit | Attending: Vascular Surgery

## 2017-06-18 ENCOUNTER — Ambulatory Visit
Admission: RE | Admit: 2017-06-18 | Discharge: 2017-06-18 | Disposition: A | Discharge status: Home / Self Care | Payer: Medicare Other | Source: Ambulatory Visit | Attending: Vascular Surgery | Admitting: Vascular Surgery

## 2017-06-18 DIAGNOSIS — E785 Hyperlipidemia, unspecified: Secondary | ICD-10-CM | POA: Insufficient documentation

## 2017-06-18 DIAGNOSIS — Z79899 Other long term (current) drug therapy: Secondary | ICD-10-CM | POA: Diagnosis not present

## 2017-06-18 DIAGNOSIS — M869 Osteomyelitis, unspecified: Secondary | ICD-10-CM | POA: Insufficient documentation

## 2017-06-18 DIAGNOSIS — Z833 Family history of diabetes mellitus: Secondary | ICD-10-CM | POA: Diagnosis not present

## 2017-06-18 DIAGNOSIS — I10 Essential (primary) hypertension: Secondary | ICD-10-CM | POA: Insufficient documentation

## 2017-06-18 DIAGNOSIS — Z888 Allergy status to other drugs, medicaments and biological substances status: Secondary | ICD-10-CM | POA: Insufficient documentation

## 2017-06-18 DIAGNOSIS — Z8249 Family history of ischemic heart disease and other diseases of the circulatory system: Secondary | ICD-10-CM | POA: Diagnosis not present

## 2017-06-18 DIAGNOSIS — I739 Peripheral vascular disease, unspecified: Secondary | ICD-10-CM

## 2017-06-18 DIAGNOSIS — I70212 Atherosclerosis of native arteries of extremities with intermittent claudication, left leg: Secondary | ICD-10-CM

## 2017-06-18 DIAGNOSIS — E118 Type 2 diabetes mellitus with unspecified complications: Secondary | ICD-10-CM | POA: Diagnosis not present

## 2017-06-18 DIAGNOSIS — Z9889 Other specified postprocedural states: Secondary | ICD-10-CM | POA: Diagnosis not present

## 2017-06-18 DIAGNOSIS — Z794 Long term (current) use of insulin: Secondary | ICD-10-CM | POA: Insufficient documentation

## 2017-06-18 DIAGNOSIS — Z7982 Long term (current) use of aspirin: Secondary | ICD-10-CM | POA: Insufficient documentation

## 2017-06-18 DIAGNOSIS — Z79890 Hormone replacement therapy (postmenopausal): Secondary | ICD-10-CM | POA: Diagnosis not present

## 2017-06-18 DIAGNOSIS — I70222 Atherosclerosis of native arteries of extremities with rest pain, left leg: Secondary | ICD-10-CM | POA: Diagnosis present

## 2017-06-18 DIAGNOSIS — Z90710 Acquired absence of both cervix and uterus: Secondary | ICD-10-CM | POA: Insufficient documentation

## 2017-06-18 DIAGNOSIS — Z993 Dependence on wheelchair: Secondary | ICD-10-CM | POA: Diagnosis not present

## 2017-06-18 HISTORY — PX: LOWER EXTREMITY ANGIOGRAPHY: CATH118251

## 2017-06-18 LAB — CREATININE, SERUM
Creatinine, Ser: 1.3 mg/dL — ABNORMAL HIGH (ref 0.44–1.00)
GFR calc Af Amer: 40 mL/min — ABNORMAL LOW
GFR calc non Af Amer: 34 mL/min — ABNORMAL LOW

## 2017-06-18 LAB — GLUCOSE, CAPILLARY: Glucose-Capillary: 80 mg/dL (ref 65–99)

## 2017-06-18 LAB — BUN: BUN: 33 mg/dL — ABNORMAL HIGH (ref 6–20)

## 2017-06-18 SURGERY — LOWER EXTREMITY ANGIOGRAPHY
Anesthesia: Moderate Sedation | Laterality: Left

## 2017-06-18 MED ORDER — SODIUM CHLORIDE 0.9 % IV SOLN: INTRAVENOUS | Status: DC

## 2017-06-18 MED ORDER — FENTANYL CITRATE (PF) 100 MCG/2ML IJ SOLN
INTRAMUSCULAR | Status: AC
  Filled 2017-06-18: qty 2

## 2017-06-18 MED ORDER — CEFAZOLIN SODIUM-DEXTROSE 2-4 GM/100ML-% IV SOLN
2.0000 g | Freq: Once | INTRAVENOUS | Status: AC
  Administered 2017-06-18: 2 g via INTRAVENOUS

## 2017-06-18 MED ORDER — HEPARIN SODIUM (PORCINE) 1000 UNIT/ML IJ SOLN
INTRAMUSCULAR | Status: DC | PRN
  Administered 2017-06-18: 4000 [IU] via INTRAVENOUS

## 2017-06-18 MED ORDER — MIDAZOLAM HCL 2 MG/2ML IJ SOLN
INTRAMUSCULAR | Status: DC | PRN
  Administered 2017-06-18: 1 mg via INTRAVENOUS

## 2017-06-18 MED ORDER — OXYCODONE HCL 5 MG PO TABS: 5.0000 mg | ORAL_TABLET | ORAL | Status: DC | PRN

## 2017-06-18 MED ORDER — SODIUM CHLORIDE 0.9 % IV SOLN: 250.0000 mL | INTRAVENOUS | Status: DC | PRN

## 2017-06-18 MED ORDER — LIDOCAINE HCL (PF) 1 % IJ SOLN
INTRAMUSCULAR | Status: AC
  Filled 2017-06-18: qty 30

## 2017-06-18 MED ORDER — CLOPIDOGREL BISULFATE 75 MG PO TABS
ORAL_TABLET | ORAL | Status: AC
  Administered 2017-06-18: 150 mg via ORAL
  Filled 2017-06-18: qty 2

## 2017-06-18 MED ORDER — SODIUM CHLORIDE 0.9% FLUSH: 3.0000 mL | Freq: Two times a day (BID) | INTRAVENOUS | Status: DC

## 2017-06-18 MED ORDER — HYDROMORPHONE HCL 1 MG/ML IJ SOLN: 1.0000 mg | Freq: Once | INTRAMUSCULAR | Status: DC | PRN

## 2017-06-18 MED ORDER — ASPIRIN EC 81 MG PO TBEC: 81.0000 mg | DELAYED_RELEASE_TABLET | Freq: Every day | ORAL | 2 refills | Status: AC

## 2017-06-18 MED ORDER — MIDAZOLAM HCL 5 MG/5ML IJ SOLN
INTRAMUSCULAR | Status: AC
  Filled 2017-06-18: qty 5

## 2017-06-18 MED ORDER — SODIUM CHLORIDE 0.9 % IV SOLN
INTRAVENOUS | Status: DC
  Administered 2017-06-18: 11:00:00 via INTRAVENOUS

## 2017-06-18 MED ORDER — MORPHINE SULFATE (PF) 4 MG/ML IV SOLN: 2.0000 mg | INTRAVENOUS | Status: DC | PRN

## 2017-06-18 MED ORDER — CLOPIDOGREL BISULFATE 75 MG PO TABS
150.0000 mg | ORAL_TABLET | ORAL | Status: AC
  Administered 2017-06-18: 150 mg via ORAL

## 2017-06-18 MED ORDER — SODIUM CHLORIDE 0.9% FLUSH: 3.0000 mL | INTRAVENOUS | Status: DC | PRN

## 2017-06-18 MED ORDER — IOPAMIDOL (ISOVUE-300) INJECTION 61%
INTRAVENOUS | Status: DC | PRN
  Administered 2017-06-18: 50 mL via INTRA_ARTERIAL

## 2017-06-18 MED ORDER — HEPARIN SODIUM (PORCINE) 1000 UNIT/ML IJ SOLN
INTRAMUSCULAR | Status: AC
  Filled 2017-06-18: qty 1

## 2017-06-18 MED ORDER — CLOPIDOGREL BISULFATE 75 MG PO TABS: 75.0000 mg | ORAL_TABLET | Freq: Every day | ORAL | 3 refills | Status: AC

## 2017-06-18 MED ORDER — ONDANSETRON HCL 4 MG/2ML IJ SOLN: 4.0000 mg | Freq: Four times a day (QID) | INTRAMUSCULAR | Status: DC | PRN

## 2017-06-18 MED ORDER — LABETALOL HCL 5 MG/ML IV SOLN: 10.0000 mg | INTRAVENOUS | Status: DC | PRN

## 2017-06-18 MED ORDER — FAMOTIDINE 20 MG PO TABS: 40.0000 mg | ORAL_TABLET | ORAL | Status: DC | PRN

## 2017-06-18 MED ORDER — HYDRALAZINE HCL 20 MG/ML IJ SOLN: 5.0000 mg | INTRAMUSCULAR | Status: DC | PRN

## 2017-06-18 MED ORDER — FENTANYL CITRATE (PF) 100 MCG/2ML IJ SOLN
INTRAMUSCULAR | Status: DC | PRN
  Administered 2017-06-18: 50 ug via INTRAVENOUS

## 2017-06-18 MED ORDER — SODIUM CHLORIDE 0.9 % IV SOLN
INTRAVENOUS | Status: AC | PRN
  Administered 2017-06-18: 250 mL via INTRAVENOUS

## 2017-06-18 MED ORDER — METHYLPREDNISOLONE SODIUM SUCC 125 MG IJ SOLR: 125.0000 mg | INTRAMUSCULAR | Status: DC | PRN

## 2017-06-18 SURGICAL SUPPLY — 18 items
BALLN ULTRVRSE 2X150X150 (BALLOONS) ×2
BALLN ULTRVRSE 2X150X150 OTW (BALLOONS) ×1
BALLOON ULTRVRSE 2X150X150 OTW (BALLOONS) ×1 IMPLANT
CATH PIG 70CM (CATHETERS) ×3 IMPLANT
CATH VERT 5FR 125CM (CATHETERS) ×3 IMPLANT
DEVICE PRESTO INFLATION (MISCELLANEOUS) ×3 IMPLANT
DEVICE SAFEGUARD 24CM (GAUZE/BANDAGES/DRESSINGS) ×3 IMPLANT
DEVICE STARCLOSE SE CLOSURE (Vascular Products) ×3 IMPLANT
DEVICE TORQUE (MISCELLANEOUS) ×3 IMPLANT
GUIDEWIRE LT ZIPWIRE 035X260 (WIRE) ×3 IMPLANT
NEEDLE ENTRY 21GA 7CM ECHOTIP (NEEDLE) ×3 IMPLANT
PACK ANGIOGRAPHY (CUSTOM PROCEDURE TRAY) ×3 IMPLANT
SET INTRO CAPELLA COAXIAL (SET/KITS/TRAYS/PACK) ×3 IMPLANT
SHEATH BRITE TIP 5FRX11 (SHEATH) ×3 IMPLANT
SHEATH RAABE 6FR (SHEATH) ×3 IMPLANT
TUBING CONTRAST HIGH PRESS 72 (TUBING) ×3 IMPLANT
WIRE COMMAND LT 018 300CM (WIRE) ×3 IMPLANT
WIRE J 3MM .035X145CM (WIRE) ×3 IMPLANT

## 2017-06-18 NOTE — Op Note (Signed)
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 Introduction catheter into left lower extremity 3rd order catheter placement  2. Contrast injection left lower extremity for distal runoff with additional 3rd order  3. Percutaneous transluminal angioplasty to 2 mm left peroneal               4. Star close closure right common femoral arteriotomy  Anesthesia: Conscious sedation was administered under my direct supervision by the interventional radiology RN. IV Versed plus fentanyl were utilized. Continuous ECG, pulse oximetry and blood pressure was monitored throughout the entire procedure.  Conscious sedation was for a total of 1 hour 17 minutes.  Sheath: 6 French Raby right common femoral artery  Contrast: 50 cc  Fluoroscopy Time: 5.6 minutes  Indications: Melissa Robbins presents with increasing pain of the left lower extremity.  She has ischemic changes to the second and third toes of both feet and significant pain left leg more so than the right.  This suggests the patient is having limb threatening ischemia.  Given the increased pain on the left compared to the right the left leg will be approached first.  The risks and benefits are reviewed all questions answered patient agrees to proceed.  Procedure:Melissa Robbins is a 84 y.o. y.o. female who was identified and appropriate procedural time out was performed. The patient was then placed supine on the table and prepped and draped in the usual sterile fashion.   Ultrasound was placed in the sterile sleeve and the right groin was evaluated the right common femoral artery was echolucent and pulsatile indicating  patency. Image was recorded for the permanent record and under real-time visualization a microneedle was inserted into the common femoral artery followed by the microwire and then the micro-sheath. A J-wire was then advanced through the micro-sheath and a 5 [place] sheath was then inserted over a J-wire. J-wire was then advanced and a 5 French pigtail catheter was positioned at the level of T12.  AP projection of the aorta was then obtained. Pigtail catheter was repositioned to above the bifurcation and a RAO view of the pelvis was obtained. Subsequently a pigtail catheter with the stiff angle Glidewire was used to cross the aortic bifurcation the catheter wire were advanced down into the left distal external iliac artery. Oblique view of the femoral bifurcation was then obtained and subsequently the wire was reintroduced and the pigtail catheter negotiated into the SFA representing third order catheter placement. Distal runoff was then performed.  4000 units of heparin was then given and allowed to circulate and a 6 [place] Raby sheath was advanced up and over the bifurcation and positioned in the femoral artery  Straight catheter and stiff angle Glidewire were then negotiated down into the distal popliteal. Catheter was then advanced. Hand injection contrast demonstrated the tibial anatomy in detail.  2 mm x 15 cm Ultraverse balloon was used to angioplasty the peroneal and tibioperoneal trunk.  The inflation was for 2 minutes at 16 atm. Follow-up imaging demonstrated excellent patency with less than 5% residual stenosis and preservation of the distal runoff.  After review of these images the sheath is pulled into the right external iliac oblique of the common femoral is obtained and a Star close device deployed. There no immediate Complications.  Findings: The abdominal aorta is opacified with a  bolus injection contrast.  The aorta itself has diffuse disease but no hemodynamically significant lesions.  The common and external iliac arteries are widely patent bilaterally.  The left common femoral is widely patent as is the profunda femoris.  The SFA and popliteal demonstrate diffuse disease but there are no hemodynamically significant lesions.  The trifurcation is diseased with occlusion of the anterior tibial and posterior tibial arteries.   The peroneal artery and tibioperoneal trunk are patent however there are multiple subtotal occlusions throughout their course.  At the level of the ankle the posterior tibial does reconstitute via peroneal collaterals and does fill the plantar branches and the pedal arch.  The distal anterior tibial and dorsalis pedis never reconstitute significantly.  Following angioplasty the peroneal now is now widely patent and demonstrates in-line flow.  It is much improved and looks quite nice. Angioplasty of the peroneal shows less than 5% residual stenosis.  Summary: Successful recanalization left lower extremity for limb salvage   Disposition: Patient was taken to the recovery room in stable condition having tolerated the procedure well.  Belenda Cruise Schnier 06/18/2017,2:50 PM

## 2017-06-18 NOTE — H&P (Signed)
[facility] History & Physical Update  The patient was interviewed and re-examined.  The patient's previous History and Physical has been reviewed and is unchanged.  There is no change in the plan of care. We plan to proceed with the scheduled procedure.  Levora DredgeGregory Jasmeet Gehl, MD  06/18/2017, 1:17 PM

## 2017-06-18 NOTE — Progress Notes (Signed)
Report called to [facility] earlier post procedure per Tyson BabinskiErin Maynor RN with questions answered. Discharge instructions and plan of care given to patient's care nurse. Giving loading dose of plavix prior to discharge of pt when able to have head of bed up.

## 2017-06-18 NOTE — Progress Notes (Signed)
Patient clinically stable post right lower leg angiogram per Dr Gilda CreaseSchnier. No bleeding nor hematoma at right groin site. Dr Gilda CreaseSchnier out to speak with daughter regarding procedure with questions answered. At pre procedure baseline remains confused. Vitals stable.

## 2017-06-19 ENCOUNTER — Encounter: Payer: Self-pay | Admitting: Vascular Surgery

## 2017-07-15 ENCOUNTER — Other Ambulatory Visit (INDEPENDENT_AMBULATORY_CARE_PROVIDER_SITE_OTHER): Payer: Self-pay | Admitting: Vascular Surgery

## 2017-07-15 DIAGNOSIS — I739 Peripheral vascular disease, unspecified: Secondary | ICD-10-CM

## 2017-07-18 ENCOUNTER — Ambulatory Visit (INDEPENDENT_AMBULATORY_CARE_PROVIDER_SITE_OTHER): Payer: Medicare Other

## 2017-07-18 ENCOUNTER — Ambulatory Visit (INDEPENDENT_AMBULATORY_CARE_PROVIDER_SITE_OTHER): Payer: Medicare Other | Admitting: Vascular Surgery

## 2017-07-18 ENCOUNTER — Encounter (INDEPENDENT_AMBULATORY_CARE_PROVIDER_SITE_OTHER): Payer: Self-pay | Admitting: Vascular Surgery

## 2017-07-18 VITALS — BP 122/83 | HR 93 | Resp 14 | Ht 65.0 in | Wt 135.0 lb

## 2017-07-18 DIAGNOSIS — E118 Type 2 diabetes mellitus with unspecified complications: Secondary | ICD-10-CM | POA: Diagnosis not present

## 2017-07-18 DIAGNOSIS — I739 Peripheral vascular disease, unspecified: Secondary | ICD-10-CM

## 2017-07-18 DIAGNOSIS — E785 Hyperlipidemia, unspecified: Secondary | ICD-10-CM

## 2017-07-18 DIAGNOSIS — I10 Essential (primary) hypertension: Secondary | ICD-10-CM | POA: Diagnosis not present

## 2017-07-18 DIAGNOSIS — I7025 Atherosclerosis of native arteries of other extremities with ulceration: Secondary | ICD-10-CM

## 2017-07-20 ENCOUNTER — Encounter (INDEPENDENT_AMBULATORY_CARE_PROVIDER_SITE_OTHER): Payer: Self-pay | Admitting: Vascular Surgery

## 2017-07-20 DIAGNOSIS — I7025 Atherosclerosis of native arteries of other extremities with ulceration: Secondary | ICD-10-CM | POA: Insufficient documentation

## 2017-07-20 NOTE — Progress Notes (Signed)
MRN : [id number]  Melissa Robbins is a 84 y.o. (03/13/1922) female who presents with chief complaint of  Chief Complaint  Patient presents with  . Follow-up    [facility] 3wk with abi  .  History of Present Illness: The patient returns to the office for followup and review status post angiogram with intervention.  She is s/p PTA of the left peroneal artery on 06/18/2017.  The patient's daughters note improvement in the lower extremity symptoms. No rest pain symptoms. Previous wounds have now healed.  No new ulcers or wounds have occurred since the last visit.  There have been no significant changes to the patient's overall health care.  The patient denies amaurosis fugax or recent TIA symptoms. There are no recent neurological changes noted. The patient denies history of DVT, PE or superficial thrombophlebitis. The patient denies recent episodes of angina or shortness of breath.   ABI's Rt=0.87 and Lt=0.94  (previous ABI's Rt=[place] and Lt=[place])   Current Meds  Medication Sig  . acetaminophen (TYLENOL) 500 MG tablet Take 500 mg by mouth every 6 (six) hours.   Marland Kitchen. aspirin EC 81 MG tablet Take 1 tablet (81 mg total) by mouth daily.  . Cholecalciferol (VITAMIN D3) 50000 UNITS CAPS Take 1 capsule by mouth every 30 (thirty) days. Taken on the 1st of the month  . clopidogrel (PLAVIX) 75 MG tablet Take 1 tablet (75 mg total) by mouth daily.  Marland Kitchen. doxycycline (DORYX) 100 MG EC tablet Take 100 mg by mouth 2 (two) times daily.  Marland Kitchen. guaiFENesin (ROBITUSSIN) 100 MG/5ML SOLN Take 10 mLs by mouth every 6 (six) hours as needed for cough.  . hydroxypropyl methylcellulose / hypromellose (ISOPTO TEARS / GONIOVISC) 2.5 % ophthalmic solution Place 1 drop into both eyes.  . insulin detemir (LEVEMIR) 100 UNIT/ML injection Inject 68 Units into the skin daily.  . insulin regular (HUMULIN R) 100 units/mL injection Inject 6 Units into the skin daily with lunch.  . levothyroxine (SYNTHROID, LEVOTHROID) 125 MCG tablet Take 125  mcg by mouth daily before breakfast.   . Multiple Vitamin (MULTIVITAMIN) tablet Take 1 tablet by mouth daily.  . mupirocin cream (BACTROBAN) 2 % Apply 1 application topically daily.  Marland Kitchen. nystatin cream (MYCOSTATIN) Apply 1 application topically 2 (two) times daily.  Marland Kitchen. omeprazole (PRILOSEC) 20 MG capsule Take 20 mg by mouth daily.  . pravastatin (PRAVACHOL) 40 MG tablet Take 40 mg by mouth daily at 6 PM.   . sennosides-docusate sodium (SENOKOT-S) 8.6-50 MG tablet Take 1 tablet by mouth 2 (two) times daily.    Past Medical History:  Diagnosis Date  . Diabetes [facility](HCC)     Past Surgical History:  Procedure Laterality Date  . ABDOMINAL HYSTERECTOMY    . LOWER EXTREMITY ANGIOGRAPHY Left 06/18/2017   Procedure: LOWER EXTREMITY ANGIOGRAPHY;  Surgeon: Renford DillsSchnier, Gregory G, MD;  Location: [facility] INVASIVE CV LAB;  Service: Cardiovascular;  Laterality: Left;  Marland Kitchen. VARICOSE VEIN SURGERY      Social History Social History   Tobacco Use  . Smoking status: Never Smoker  . Smokeless tobacco: Never Used  Substance Use Topics  . Alcohol use: No  . Drug use: No    Family History Family History  Problem Relation Age of Onset  . Diabetes Daughter   . Diabetes Son   . Hypertension Son     Allergies  Allergen Reactions  . Zinc Oxide Other (See Comments)    unknown     REVIEW OF SYSTEMS (Negative unless checked)  Constitutional: [] Weight loss  [] Fever  [] Chills Cardiac: [] Chest pain   [] Chest pressure   [] Palpitations   [] Shortness of breath when laying flat   [] Shortness of breath with exertion. Vascular:  [] Pain in legs with walking   [] Pain in legs at rest  [] History of DVT   [] Phlebitis   [] Swelling in legs   [] Varicose veins   [x] Non-healing ulcers Pulmonary:   [] Uses home oxygen   [] Productive cough   [] Hemoptysis   [] Wheeze  [] COPD   [] Asthma Neurologic:  [] Dizziness   [] Seizures   [] History of stroke   [] History of TIA  [] Aphasia   [] Vissual changes   [] Weakness or numbness in arm    [] Weakness or numbness in leg Musculoskeletal:   [] Joint swelling   [] Joint pain   [] Low back pain Hematologic:  [] Easy bruising  [] Easy bleeding   [] Hypercoagulable state   [] Anemic Gastrointestinal:  [] Diarrhea   [] Vomiting  [] Gastroesophageal reflux/heartburn   [] Difficulty swallowing. Genitourinary:  [] Chronic kidney disease   [] Difficult urination  [] Frequent urination   [] Blood in urine Skin:  [] Rashes   [x] Ulcers  Psychological:  [] History of anxiety   []  History of major depression.  Physical Examination  Vitals:   07/18/17 1134  BP: 122/83  Pulse: 93  Resp: 14  Weight: 135 lb (61.2 kg)  Height: 5\' 5"  (1.651 m)   Body mass index is 22.47 kg/m. Gen: WD/WN, NAD Head: [place]/AT, No temporalis wasting.  Ear/Nose/Throat: Hearing grossly intact, nares w/o erythema or drainage Eyes: PER, EOMI, sclera nonicteric.  Neck: Supple, no large masses.   Pulmonary:  Good air movement, no audible wheezing bilaterally, no use of accessory muscles.  Cardiac: RRR, no JVD Vascular: left 2nd toe ulcer dry and noninfected appearing Vessel Right Left  Radial Palpable Palpable  PT Not Palpable Not Palpable  DP Not Palpable Not Palpable  Gastrointestinal: Non-distended. No guarding/no peritoneal signs.  Musculoskeletal: M/S 5/5 throughout.  No deformity or atrophy.  Neurologic: CN 2-12 intact. Symmetrical.  Speech is fluent. Motor exam as listed above. Psychiatric: Judgment intact, Mood & affect appropriate for pt's clinical situation. Dermatologic: No rashes + ulcers noted.  No changes consistent with cellulitis. Lymph : No lichenification or skin changes of chronic lymphedema.  CBC Lab Results  Component Value Date   WBC 8.0 11/14/2014   HGB 12.4 11/14/2014   HCT 37.1 11/14/2014   MCV 90.1 11/14/2014   PLT 250 11/14/2014    BMET    Component Value Date/Time   NA 136 11/14/2014 1354   NA 135 (L) 04/22/2013 1330   K 4.7 11/14/2014 1354   K 4.6 04/22/2013 1330   CL 104 11/14/2014  1354   CL 104 04/22/2013 1330   CO2 24 11/14/2014 1354   CO2 23 04/22/2013 1330   GLUCOSE 136 (H) 11/14/2014 1354   GLUCOSE 235 (H) 04/22/2013 1330   BUN 33 (H) 06/18/2017 1103   BUN 28 (H) 04/22/2013 1330   CREATININE 1.30 (H) 06/18/2017 1103   CREATININE 0.96 04/22/2013 1330   CALCIUM 9.8 11/14/2014 1354   CALCIUM 9.6 04/22/2013 1330   GFRNONAA 34 (L) 06/18/2017 1103   GFRNONAA 52 (L) 04/22/2013 1330   GFRAA 40 (L) 06/18/2017 1103   GFRAA >60 04/22/2013 1330   CrCl cannot be calculated (Patient's most recent lab result is older than the maximum 21 days allowed.).  COAG Lab Results  Component Value Date   INR 1.04 11/14/2014   INR 1.02 01/22/2009   INR 1.0 12/25/2008  Radiology No results found.  Assessment/Plan 1. Atherosclerosis of native arteries of the extremities with ulceration (HCC) Recommend:  The patient is status post successful angiogram with intervention.  The patient reports that the claudication symptoms and leg pain is essentially gone.   The patient denies lifestyle limiting changes at this point in time.  No further invasive studies, angiography or surgery at this time The patient should continue walking and begin a more formal exercise program.  The patient should continue antiplatelet therapy and aggressive treatment of the lipid abnormalities  Smoking cessation was again discussed  The patient should continue wearing graduated compression socks 10-15 mmHg strength to control the mild edema.  Patient should undergo noninvasive studies as ordered. The patient will follow up with me after the studies.   - VAS [place] LOWER EXTREMITY ARTERIAL DUPLEX; Future - VAS [place] ABI WITH/WO TBI; Future  2. Essential hypertension Continue antihypertensive medications as already ordered, these medications have been reviewed and there are no changes at this time.   3. Type 2 diabetes mellitus with complication, unspecified whether long term insulin use  (HCC) Continue hypoglycemic medications as already ordered, these medications have been reviewed and there are no changes at this time.  Hgb A1C to be monitored as already arranged by primary service   4. Hyperlipidemia, unspecified hyperlipidemia type Continue statin as ordered and reviewed, no changes at this time     Levora Dredge, MD  07/20/2017 3:28 PM

## 2017-10-24 ENCOUNTER — Encounter (INDEPENDENT_AMBULATORY_CARE_PROVIDER_SITE_OTHER): Payer: Medicare Other

## 2017-10-24 ENCOUNTER — Ambulatory Visit (INDEPENDENT_AMBULATORY_CARE_PROVIDER_SITE_OTHER): Payer: Medicare Other | Admitting: Vascular Surgery

## 2018-01-27 ENCOUNTER — Encounter (INDEPENDENT_AMBULATORY_CARE_PROVIDER_SITE_OTHER): Payer: Self-pay | Admitting: Vascular Surgery

## 2018-01-27 ENCOUNTER — Encounter

## 2018-01-27 ENCOUNTER — Ambulatory Visit (INDEPENDENT_AMBULATORY_CARE_PROVIDER_SITE_OTHER): Payer: Medicare Other

## 2018-01-27 ENCOUNTER — Ambulatory Visit (INDEPENDENT_AMBULATORY_CARE_PROVIDER_SITE_OTHER): Payer: Medicare Other | Admitting: Vascular Surgery

## 2018-01-27 VITALS — BP 148/92 | HR 100 | Resp 17 | Ht 64.0 in | Wt 138.0 lb

## 2018-01-27 DIAGNOSIS — I10 Essential (primary) hypertension: Secondary | ICD-10-CM

## 2018-01-27 DIAGNOSIS — E118 Type 2 diabetes mellitus with unspecified complications: Secondary | ICD-10-CM

## 2018-01-27 DIAGNOSIS — I7025 Atherosclerosis of native arteries of other extremities with ulceration: Secondary | ICD-10-CM

## 2018-01-27 DIAGNOSIS — E785 Hyperlipidemia, unspecified: Secondary | ICD-10-CM

## 2018-01-29 ENCOUNTER — Encounter (INDEPENDENT_AMBULATORY_CARE_PROVIDER_SITE_OTHER): Payer: Self-pay | Admitting: Vascular Surgery

## 2018-01-29 NOTE — Progress Notes (Signed)
MRN : [id number]  Melissa Robbins is a 84 y.o. (December 16, 1921) female who presents with chief complaint of  Chief Complaint  Patient presents with  . Follow-up    ABI and Arterial  .  History of Present Illness:  The patient returns to the office for followup and review of the noninvasive studies. There have been no interval changes in lower extremity symptoms. No interval development of rest pain symptoms. No new ulcers or wounds have occurred since the last visit, although there is an area on the dorsum of the left foot that was recently injured, it is not an open wound at this point.  There have been no significant changes to the patient's overall health care.  The patient denies amaurosis fugax or recent TIA symptoms. There are no recent neurological changes noted. The patient denies history of DVT, PE or superficial thrombophlebitis. The patient denies recent episodes of angina or shortness of breath.   ABI Rt=[place] and Lt=[place]  (previous ABI's Rt=0.87 and Lt=[place]) Duplex ultrasound of the bilateral lower extremities demonstrates triphasic signals to the distal SFA the tibial vessels transition to monophasic signals.  Current Meds  Medication Sig  . acetaminophen (TYLENOL) 500 MG tablet Take 500 mg by mouth every 6 (six) hours.   Marland Kitchen aspirin EC 81 MG tablet Take 1 tablet (81 mg total) by mouth daily.  . Cholecalciferol (VITAMIN D3) 50000 UNITS CAPS Take 1 capsule by mouth every 30 (thirty) days. Taken on the 1st of the month  . clopidogrel (PLAVIX) 75 MG tablet Take 1 tablet (75 mg total) by mouth daily.  . hydroxypropyl methylcellulose / hypromellose (ISOPTO TEARS / GONIOVISC) 2.5 % ophthalmic solution Place 1 drop into both eyes.  . insulin detemir (LEVEMIR) 100 UNIT/ML injection Inject 68 Units into the skin daily.  . insulin regular (HUMULIN R) 100 units/mL injection Inject 6 Units into the skin daily with lunch.  . levothyroxine (SYNTHROID, LEVOTHROID) 100 MCG tablet   .  linagliptin (TRADJENTA) 5 MG TABS tablet   . Multiple Vitamin (MULTIVITAMIN) tablet Take 1 tablet by mouth daily.  . mupirocin cream (BACTROBAN) 2 % Apply 1 application topically daily.  Marland Kitchen nystatin cream (MYCOSTATIN) Apply 1 application topically 2 (two) times daily.  Marland Kitchen omeprazole (PRILOSEC) 20 MG capsule Take 20 mg by mouth daily.  . pravastatin (PRAVACHOL) 40 MG tablet Take 40 mg by mouth daily at 6 PM.   . sennosides-docusate sodium (SENOKOT-S) 8.6-50 MG tablet Take 1 tablet by mouth 2 (two) times daily.  . vitamin B-12 (CYANOCOBALAMIN) 500 MCG tablet Take 500 mcg by mouth daily.   . [DISCONTINUED] alendronate (FOSAMAX) 70 MG tablet Take by mouth.  . [DISCONTINUED] amLODipine (NORVASC) 10 MG tablet Take by mouth.  . [DISCONTINUED] Aspirin-Calcium Carbonate 81-777 MG TABS Take by mouth.  . [DISCONTINUED] doxycycline (DORYX) 100 MG EC tablet Take 100 mg by mouth 2 (two) times daily.  . [DISCONTINUED] glimepiride (AMARYL) 4 MG tablet Take 4 mg by mouth daily with breakfast.  . [DISCONTINUED] insulin lispro (HUMALOG) 100 UNIT/ML injection Inject into the skin.  . [DISCONTINUED] metFORMIN (GLUCOPHAGE-XR) 750 MG 24 hr tablet Take by mouth.    Past Medical History:  Diagnosis Date  . Diabetes [facility])     Past Surgical History:  Procedure Laterality Date  . ABDOMINAL HYSTERECTOMY    . LOWER EXTREMITY ANGIOGRAPHY Left 06/18/2017   Procedure: LOWER EXTREMITY ANGIOGRAPHY;  Surgeon: Renford Dills, MD;  Location: [facility] INVASIVE CV LAB;  Service: Cardiovascular;  Laterality: Left;  .  VARICOSE VEIN SURGERY      Social History Social History   Tobacco Use  . Smoking status: Never Smoker  . Smokeless tobacco: Never Used  Substance Use Topics  . Alcohol use: No  . Drug use: No    Family History Family History  Problem Relation Age of Onset  . Diabetes Daughter   . Diabetes Son   . Hypertension Son     Allergies  Allergen Reactions  . Zinc Oxide Other (See Comments)    unknown      REVIEW OF SYSTEMS (Negative unless checked)  Constitutional: [] Weight loss  [] Fever  [] Chills Cardiac: [] Chest pain   [] Chest pressure   [] Palpitations   [] Shortness of breath when laying flat   [] Shortness of breath with exertion. Vascular:  [] Pain in legs with walking   [] Pain in legs at rest  [] History of DVT   [] Phlebitis   [] Swelling in legs   [] Varicose veins   [] Non-healing ulcers Pulmonary:   [] Uses home oxygen   [] Productive cough   [] Hemoptysis   [] Wheeze  [] COPD   [] Asthma Neurologic:  [] Dizziness   [] Seizures   [] History of stroke   [] History of TIA  [] Aphasia   [] Vissual changes   [] Weakness or numbness in arm   [] Weakness or numbness in leg Musculoskeletal:   [] Joint swelling   [] Joint pain   [] Low back pain Hematologic:  [] Easy bruising  [] Easy bleeding   [] Hypercoagulable state   [] Anemic Gastrointestinal:  [] Diarrhea   [] Vomiting  [] Gastroesophageal reflux/heartburn   [] Difficulty swallowing. Genitourinary:  [] Chronic kidney disease   [] Difficult urination  [] Frequent urination   [] Blood in urine Skin:  [] Rashes   [] Ulcers  Psychological:  [] History of anxiety   []  History of major depression.  Physical Examination  Vitals:   01/27/18 1557  BP: (!) 148/92  Pulse: 100  Resp: 17  Weight: 138 lb (62.6 kg)  Height: 5\' 4"  (1.626 m)   Body mass index is 23.69 kg/m. Gen: WD/WN, NAD; frail appearing seen in a wheelchair Head: [place]/AT, No temporalis wasting.  Ear/Nose/Throat: Hearing grossly intact, nares w/o erythema or drainage Eyes: PER, EOMI, sclera nonicteric.  Neck: Supple, no large masses.   Pulmonary:  Good air movement, no audible wheezing bilaterally, no use of accessory muscles.  Cardiac: RRR, no JVD Vascular: There are small dry ulcers that are nearly healed of both the right and left foot.  The area of the left foot that sustained a blunt force injury appears purple in color the skin is intact at this time Vessel Right Left  Radial Palpable Palpable  PT  Not Palpable Not Palpable  DP Not Palpable Not Palpable  Gastrointestinal: Non-distended. No guarding/no peritoneal signs.  Musculoskeletal: M/S 5/5 throughout.  No deformity or atrophy.  Neurologic: CN 2-12 intact. Symmetrical.  Speech is fluent. Motor exam as listed above. Psychiatric: Judgment intact, Mood & affect appropriate for pt's clinical situation. Dermatologic: No rashes or ulcers noted.  No changes consistent with cellulitis. Lymph : No lichenification or skin changes of chronic lymphedema.  CBC Lab Results  Component Value Date   WBC 8.0 11/14/2014   HGB 12.4 11/14/2014   HCT 37.1 11/14/2014   MCV 90.1 11/14/2014   PLT 250 11/14/2014    BMET    Component Value Date/Time   NA 136 11/14/2014 1354   NA 135 (L) 04/22/2013 1330   K 4.7 11/14/2014 1354   K 4.6 04/22/2013 1330   CL 104 11/14/2014 1354   CL 104 04/22/2013  1330   CO2 24 11/14/2014 1354   CO2 23 04/22/2013 1330   GLUCOSE 136 (H) 11/14/2014 1354   GLUCOSE 235 (H) 04/22/2013 1330   BUN 33 (H) 06/18/2017 1103   BUN 28 (H) 04/22/2013 1330   CREATININE 1.30 (H) 06/18/2017 1103   CREATININE 0.96 04/22/2013 1330   CALCIUM 9.8 11/14/2014 1354   CALCIUM 9.6 04/22/2013 1330   GFRNONAA 34 (L) 06/18/2017 1103   GFRNONAA 52 (L) 04/22/2013 1330   GFRAA 40 (L) 06/18/2017 1103   GFRAA >60 04/22/2013 1330   CrCl cannot be calculated (Patient's most recent lab result is older than the maximum 21 days allowed.).  COAG Lab Results  Component Value Date   INR 1.04 11/14/2014   INR 1.02 01/22/2009   INR 1.0 12/25/2008    Radiology No results found.   Assessment/Plan 1. Atherosclerosis of native arteries of the extremities with ulceration (HCC)  Recommend:  The patient has evidence of atherosclerosis of the lower extremities with claudication.  The patient does not voice lifestyle limiting changes at this point in time.  Although the area of the left foot is concerning it has not ulcerated at this time.  The  patient is denying pain.  Given her age we will continue conservative care.  I will plan to see her back in 6 months with an ABI.  I have asked that they call me if indeed the left foot does truly breakdown and become an open wound.  At that point I would see her sooner and we would likely need to plan for angiography given her monophasic signals.  Noninvasive studies do not suggest clinically significant change.  No invasive studies, angiography or surgery at this time The patient should continue exercise program as tolerated.  The patient should continue antiplatelet therapy and aggressive treatment of the lipid abnormalities  No changes in the patient's medications at this time  The patient should continue wearing graduated compression socks 10-15 mmHg strength to control the mild edema.   - VAS [place] ABI WITH/WO TBI; Future  2. Essential hypertension Continue antihypertensive medications as already ordered, these medications have been reviewed and there are no changes at this time.   3. Type 2 diabetes mellitus with complication (HCC) Continue hypoglycemic medications as already ordered, these medications have been reviewed and there are no changes at this time.  Hgb A1C to be monitored as already arranged by primary service   4. Hyperlipidemia, unspecified hyperlipidemia type Continue statin as ordered and reviewed, no changes at this time     Levora Dredge, MD  01/29/2018 2:23 PM

## 2018-02-20 ENCOUNTER — Encounter

## 2018-02-20 ENCOUNTER — Ambulatory Visit (INDEPENDENT_AMBULATORY_CARE_PROVIDER_SITE_OTHER): Payer: Medicare Other | Admitting: Vascular Surgery

## 2018-02-20 ENCOUNTER — Encounter (INDEPENDENT_AMBULATORY_CARE_PROVIDER_SITE_OTHER): Payer: Medicare Other

## 2018-07-16 DEATH — deceased

## 2018-07-31 ENCOUNTER — Ambulatory Visit (INDEPENDENT_AMBULATORY_CARE_PROVIDER_SITE_OTHER): Payer: Medicare Other | Admitting: Vascular Surgery

## 2018-07-31 ENCOUNTER — Encounter (INDEPENDENT_AMBULATORY_CARE_PROVIDER_SITE_OTHER): Payer: Medicare Other

## 2018-09-04 ENCOUNTER — Ambulatory Visit (INDEPENDENT_AMBULATORY_CARE_PROVIDER_SITE_OTHER): Payer: Medicare Other | Admitting: Vascular Surgery

## 2018-09-04 ENCOUNTER — Encounter (INDEPENDENT_AMBULATORY_CARE_PROVIDER_SITE_OTHER): Payer: Medicare Other
